# Patient Record
Sex: Male | Born: 1940 | Race: White | Hispanic: No | Marital: Married | Smoking: Former smoker
Health system: Southern US, Community
[De-identification: ages and names within clinical notes are randomized; demographics above are authoritative.]

## PROBLEM LIST (undated history)

## (undated) DIAGNOSIS — E785 Hyperlipidemia, unspecified: Secondary | ICD-10-CM

## (undated) DIAGNOSIS — I714 Abdominal aortic aneurysm, without rupture, unspecified: Secondary | ICD-10-CM

## (undated) DIAGNOSIS — K7460 Unspecified cirrhosis of liver: Secondary | ICD-10-CM

## (undated) DIAGNOSIS — N189 Chronic kidney disease, unspecified: Secondary | ICD-10-CM

## (undated) DIAGNOSIS — H6691 Otitis media, unspecified, right ear: Secondary | ICD-10-CM

## (undated) DIAGNOSIS — K760 Fatty (change of) liver, not elsewhere classified: Secondary | ICD-10-CM

## (undated) DIAGNOSIS — D61818 Other pancytopenia: Secondary | ICD-10-CM

## (undated) DIAGNOSIS — N529 Male erectile dysfunction, unspecified: Secondary | ICD-10-CM

## (undated) DIAGNOSIS — I6529 Occlusion and stenosis of unspecified carotid artery: Secondary | ICD-10-CM

## (undated) DIAGNOSIS — K7030 Alcoholic cirrhosis of liver without ascites: Secondary | ICD-10-CM

## (undated) DIAGNOSIS — I10 Essential (primary) hypertension: Secondary | ICD-10-CM

## (undated) DIAGNOSIS — G459 Transient cerebral ischemic attack, unspecified: Secondary | ICD-10-CM

## (undated) HISTORY — DX: Occlusion and stenosis of unspecified carotid artery: I65.29

## (undated) HISTORY — PX: CATARACT EXTRACTION, BILATERAL: SHX1313

## (undated) HISTORY — DX: Alcoholic cirrhosis of liver without ascites: K70.30

## (undated) HISTORY — PX: KIDNEY DONATION: SHX685

## (undated) HISTORY — PX: ESOPHAGOGASTRODUODENOSCOPY: SHX1529

## (undated) HISTORY — DX: Chronic kidney disease, unspecified: N18.9

## (undated) HISTORY — DX: Male erectile dysfunction, unspecified: N52.9

## (undated) HISTORY — DX: Transient cerebral ischemic attack, unspecified: G45.9

## (undated) HISTORY — DX: Unspecified cirrhosis of liver: K74.60

## (undated) HISTORY — DX: Abdominal aortic aneurysm, without rupture: I71.4

## (undated) HISTORY — DX: Fatty (change of) liver, not elsewhere classified: K76.0

## (undated) HISTORY — DX: Otitis media, unspecified, right ear: H66.91

## (undated) HISTORY — DX: Essential (primary) hypertension: I10

## (undated) HISTORY — DX: Abdominal aortic aneurysm, without rupture, unspecified: I71.40

## (undated) HISTORY — DX: Hyperlipidemia, unspecified: E78.5

---

## 2008-04-19 HISTORY — PX: CHOLECYSTECTOMY: SHX55

## 2010-01-21 ENCOUNTER — Ambulatory Visit (HOSPITAL_COMMUNITY): Admission: RE | Admit: 2010-01-21 | Discharge: 2010-01-21 | Payer: Self-pay | Admitting: Family Medicine

## 2010-06-07 ENCOUNTER — Emergency Department (HOSPITAL_COMMUNITY)
Admission: EM | Admit: 2010-06-07 | Discharge: 2010-06-07 | Payer: Self-pay | Source: Home / Self Care | Admitting: Emergency Medicine

## 2010-10-17 ENCOUNTER — Ambulatory Visit: Payer: Self-pay | Admitting: Internal Medicine

## 2010-11-05 ENCOUNTER — Ambulatory Visit (HOSPITAL_COMMUNITY)
Admission: RE | Admit: 2010-11-05 | Discharge: 2010-11-05 | Payer: Self-pay | Source: Home / Self Care | Attending: Ophthalmology | Admitting: Ophthalmology

## 2010-11-14 ENCOUNTER — Ambulatory Visit: Admit: 2010-11-14 | Payer: Self-pay | Admitting: Internal Medicine

## 2010-11-14 ENCOUNTER — Ambulatory Visit (HOSPITAL_COMMUNITY)
Admission: RE | Admit: 2010-11-14 | Discharge: 2010-11-14 | Payer: Self-pay | Source: Home / Self Care | Attending: Internal Medicine | Admitting: Internal Medicine

## 2010-11-14 HISTORY — PX: UPPER GASTROINTESTINAL ENDOSCOPY: SHX188

## 2010-11-15 LAB — H. PYLORI ANTIBODY, IGG: H Pylori IgG: 0.4 {ISR}

## 2010-12-01 ENCOUNTER — Encounter: Payer: Self-pay | Admitting: Family Medicine

## 2011-01-13 ENCOUNTER — Ambulatory Visit (INDEPENDENT_AMBULATORY_CARE_PROVIDER_SITE_OTHER): Payer: Self-pay | Admitting: Internal Medicine

## 2011-01-20 ENCOUNTER — Ambulatory Visit (INDEPENDENT_AMBULATORY_CARE_PROVIDER_SITE_OTHER): Payer: Self-pay | Admitting: Internal Medicine

## 2011-01-20 LAB — BASIC METABOLIC PANEL
BUN: 15 mg/dL (ref 6–23)
CO2: 25 mEq/L (ref 19–32)
Calcium: 9.5 mg/dL (ref 8.4–10.5)
Chloride: 107 mEq/L (ref 96–112)
Creatinine, Ser: 1.34 mg/dL (ref 0.4–1.5)
GFR calc Af Amer: >60 mL/min (ref >60)
GFR calc non Af Amer: 53 mL/min — ABNORMAL LOW (ref >60)
Glucose, Bld: 164 mg/dL — ABNORMAL HIGH (ref 70–99)
Potassium: 3.5 mEq/L (ref 3.5–5.1)
Sodium: 140 mEq/L (ref 135–145)

## 2011-01-20 LAB — HEMOGLOBIN AND HEMATOCRIT, BLOOD
HCT: 35.9 % — ABNORMAL LOW (ref 39.0–52.0)
Hemoglobin: 13 g/dL (ref 13.0–17.0)

## 2011-01-25 LAB — CBC
HCT: 35.3 % — ABNORMAL LOW (ref 39.0–52.0)
Hemoglobin: 12.4 g/dL — ABNORMAL LOW (ref 13.0–17.0)
MCH: 34.7 pg — ABNORMAL HIGH (ref 26.0–34.0)
MCHC: 35.1 g/dL (ref 30.0–36.0)
MCV: 98.8 fL (ref 78.0–100.0)
Platelets: 54 10*3/uL — ABNORMAL LOW (ref 150–400)
RBC: 3.57 MIL/uL — ABNORMAL LOW (ref 4.22–5.81)
RDW: 14.8 % (ref 11.5–15.5)
WBC: 3.5 10*3/uL — ABNORMAL LOW (ref 4.0–10.5)

## 2011-01-25 LAB — POCT I-STAT, CHEM 8
BUN: 12 mg/dL (ref 6–23)
Calcium, Ion: 1.12 mmol/L (ref 1.12–1.32)
Chloride: 111 mEq/L (ref 96–112)
Creatinine, Ser: 1.5 mg/dL (ref 0.4–1.5)
Glucose, Bld: 118 mg/dL — ABNORMAL HIGH (ref 70–99)
HCT: 36 % — ABNORMAL LOW (ref 39.0–52.0)
Hemoglobin: 12.2 g/dL — ABNORMAL LOW (ref 13.0–17.0)
Potassium: 3.3 mEq/L — ABNORMAL LOW (ref 3.5–5.1)
Sodium: 144 mEq/L (ref 135–145)
TCO2: 23 mmol/L (ref 0–100)

## 2011-01-25 LAB — DIFFERENTIAL
Basophils Absolute: 0 10*3/uL (ref 0.0–0.1)
Basophils Relative: 1 % (ref 0–1)
Eosinophils Absolute: 0.1 10*3/uL (ref 0.0–0.7)
Eosinophils Relative: 4 % (ref 0–5)
Lymphocytes Relative: 23 % (ref 12–46)
Lymphs Abs: 0.8 10*3/uL (ref 0.7–4.0)
Monocytes Absolute: 0.2 10*3/uL (ref 0.1–1.0)
Monocytes Relative: 6 % (ref 3–12)
Neutro Abs: 2.3 10*3/uL (ref 1.7–7.7)
Neutrophils Relative %: 66 % (ref 43–77)

## 2011-01-25 LAB — BASIC METABOLIC PANEL
BUN: 13 mg/dL (ref 6–23)
CO2: 24 mEq/L (ref 19–32)
Calcium: 8.5 mg/dL (ref 8.4–10.5)
Chloride: 111 mEq/L (ref 96–112)
Creatinine, Ser: 1.25 mg/dL (ref 0.4–1.5)
GFR calc Af Amer: >60 mL/min (ref >60)
GFR calc non Af Amer: 57 mL/min — ABNORMAL LOW (ref >60)
Glucose, Bld: 125 mg/dL — ABNORMAL HIGH (ref 70–99)
Potassium: 3.3 mEq/L — ABNORMAL LOW (ref 3.5–5.1)
Sodium: 142 mEq/L (ref 135–145)

## 2011-02-13 LAB — BASIC METABOLIC PANEL
Glucose: 131 mg/dL
Potassium: 3.6 mmol/L (ref 3.4–5.3)
Sodium: 141 mmol/L (ref 137–147)

## 2011-02-13 LAB — HEPATIC FUNCTION PANEL
ALT: 40 U/L (ref 10–40)
AST: 48 U/L — AB (ref 14–40)

## 2011-02-13 LAB — CBC AND DIFFERENTIAL
HCT: 36 % — AB (ref 41–53)
Hemoglobin: 12.4 g/dL — AB (ref 13.5–17.5)
Platelets: 54 10*3/uL — AB (ref 150–399)

## 2011-02-17 ENCOUNTER — Ambulatory Visit (INDEPENDENT_AMBULATORY_CARE_PROVIDER_SITE_OTHER): Payer: Medicare Other | Admitting: Internal Medicine

## 2011-02-17 ENCOUNTER — Ambulatory Visit (INDEPENDENT_AMBULATORY_CARE_PROVIDER_SITE_OTHER): Payer: Self-pay | Admitting: Internal Medicine

## 2011-02-17 DIAGNOSIS — K7689 Other specified diseases of liver: Secondary | ICD-10-CM

## 2011-03-21 NOTE — Consult Note (Signed)
NAME:  Charles Hull, ALDACO NO.:  [id number]  MEDICAL RECORD NO.:  [id number]           PATIENT TYPE:  LOCATION:                                 FACILITY:  PHYSICIAN:  Lionel December, M.D.    DATE OF BIRTH:  [birth year]  DATE OF CONSULTATION: 02/17/2011 DATE OF DISCHARGE:                                CONSULTATION   REASON FOR CONSULTATION:  Followup for nonalcoholic cirrhosis.  LOCATION:  [facility].  HISTORY OF PRESENT ILLNESS:  Charles Hull is a 70 year old male presenting today for follow up of his nonalcoholic liver disease.  He was seen initially by Dr. Karilyn Cota in September 2009 for newly diagnosed cirrhosis that was found during cholecystectomy.  He did have a negative workup and Dr. 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  There is a family history of NAFLD.  He apparently did not follow up.  His hepatitis B and C markers were negative.  His AST was normal in August 2011.  He was admitted to [facility] with a febrile illness.  He did undergo an abdominal tap.  The tap was performed after he had been on antibiotics for few days.  The fluid cultures were negative.  Today, Charles Hull states he feels okay.  He says his abdomen feels more distended than usual.  There has been no weight gain.  His appetite is good.  He usually has a bowel movement 1 every 2 days.  He denies any melena or rectal bleeding.  Mr. Even did undergo an EGD in January of this year.  The EGD revealed grade 1-2 four columns of esophageal varices, portal gastropathy, 3-mm gastric ulcer at the antrum.  RECENT LABS:  On February 13, 2011, his ALP 90, AST 48, ALT 40, albumin 3.4. INR 1.2, PT is slightly elevated at 15.  Hemoglobin 12.4, hematocrit 35.8, platelets 54.  AFP 4.9, creatinine slightly elevated at 1.42, BUN 17.  HOME MEDICATIONS: 1. Flomax 0.4 mg daily. 2. Spirolactone 50 mg 1 b.i.d. 3. Lasix 40 mg Monday, Wednesday, and Fridays. 4. Cipro 750 mg  once a week. 5. B12 1000 mcg a day. 6. MVI once a day. 7. Nadolol 40 mg a day. 8. Protonix 40 mg a day. 9. Cipro 400 mg on Saturdays, 750-mg Cipro.  There are no known allergies.  OBJECTIVE:  VITAL SIGNS:  His weight is 192.6, his last weight in August was 193, his blood pressure is 112/60, his temperature is 97.4, his pulse is 80. HEENT:  His conjunctivae pink.  His sclerae anicteric.  Thyroid normal. There is no cervical lymphadenopathy. LUNGS:  Clear. HEART:  Regular rate and rhythm. ABDOMEN:  Appears distended but soft.  His bowel sounds are positive.  I am unable to palpate his liver.  Bowel sounds are positive.  He has 1+ edema to his lower extremities.  ASSESSMENT:  Charles Hull has cirrhosis, possibly secondary to nonalcoholic fatty liver disease which has been complicated by ascites. Today, he seems to be doing well.  His abdomen is distended but it is soft.  He does complain also of a  rash to his left flank.  This rash appears to come and go, says does not itch, it is not causing pain.  RECOMMENDATIONS:  He will follow up in 3 months with a CBC, PT/INR, and chem-20.  He will continue present medications.  He will increase his Lasix to 5 days a week.  His last potassium on February 13, 2011, was 3.6.    ______________________________ Dorene Ar, NP   ______________________________ Lionel December, M.D.    TS/MEDQ  D:  02/17/2011  T:  02/18/2011  Job:  [id number]  Electronically Signed by Dorene Ar PA on 03/20/2011 05:22:42 PM Electronically Signed by Lionel December M.D. on 03/21/2011 12:09:56 PM

## 2011-06-26 ENCOUNTER — Encounter (INDEPENDENT_AMBULATORY_CARE_PROVIDER_SITE_OTHER): Payer: Self-pay

## 2011-08-12 ENCOUNTER — Other Ambulatory Visit (INDEPENDENT_AMBULATORY_CARE_PROVIDER_SITE_OTHER): Payer: Self-pay | Admitting: Internal Medicine

## 2011-08-12 ENCOUNTER — Encounter (INDEPENDENT_AMBULATORY_CARE_PROVIDER_SITE_OTHER): Payer: Self-pay | Admitting: Internal Medicine

## 2011-08-12 ENCOUNTER — Ambulatory Visit (INDEPENDENT_AMBULATORY_CARE_PROVIDER_SITE_OTHER): Payer: Medicare Other | Admitting: Internal Medicine

## 2011-08-12 VITALS — BP 142/70 | HR 76 | Temp 98.1°F | Ht 68.0 in | Wt 201.2 lb

## 2011-08-12 DIAGNOSIS — R188 Other ascites: Secondary | ICD-10-CM

## 2011-08-12 DIAGNOSIS — K7460 Unspecified cirrhosis of liver: Secondary | ICD-10-CM

## 2011-08-12 NOTE — Progress Notes (Signed)
Subjective:     Patient ID: Charles Hull, male   DOB: 05/20/1941, 70 y.o.   MRN: [id number]  HPI  Presenting today with c/o that he has gained about 6-7 pounds over the last 6 days. He says his abdomen is some swollen.  He has a history of cirrhosis secondary to NASH. He had a SBP last year.  He was diagnosed in 2009 after undergoing a cholecystecomy.  EGD 11/15/2010  Grade 1-2 four columns of esophageal varices. Portal gastropathy. 3mm gastric ulcer at antrum.   Appetite is good.  Occasionally he has epigastric pain when he lies down.  He has a BM about every day.  Stools are dark brown.  His last US abdomen 03/08/2008: Contracted thick walled GB with stones. Suspicious for chronic cholecystitis. No biliary ductal dilatation. Status post left nephrectomy. RT renal cyst. Multifocal ectasia of the distal abdominal aorta.  08/01/2011 Glucose 18, BUN 17, Creatinine 1.33, Sodium 140, K 3.5, Chloride 107, Albumin 3.4, Total globulin 2.8, Total bili 1.1, ALP 92, AST 43, ALT 34, HA1C 6.2  02/13/2011 AFP 4.9, H and H 12.4 and 35.8, MCV 98.3, Platelet ct 54, PT/INR 15.0 and 1.2, ALP 90, AST 48, ALT 40,   Review of Systems  See hpi     Objective:   Physical Exam Alert and oriented. Skin warm and dry. Oral mucosa is moist.  . Sclera anicteric, conjunctivae is pink. Thyroid not enlarged. No cervical lymphadenopathy. Lungs clear. Heart regular rate and rhythm.  Abdomen is soft, distended. Abdomen is not tense.  Bowel sounds are positive. No hepatomegaly. No abdominal masses felt. No tenderness.  2-3+ edema to lower extremities. Patient is alert and oriented.     Assessment:  Cirrhosis secondary to NAFLD. Ascites., probable. Hx of SBP last year.  His cultures were negative. He is maintained on Cipro once a week.  Meld score 13.     Plan:     Will see back in 6 months with  Cmet,CBC, PT/INR.  continue Lasix 40mg  5 days a weeks.  Increase Spironolactone 50mg  to twice a day.

## 2011-08-13 ENCOUNTER — Telehealth (INDEPENDENT_AMBULATORY_CARE_PROVIDER_SITE_OTHER): Payer: Self-pay | Admitting: *Deleted

## 2011-08-13 DIAGNOSIS — K7460 Unspecified cirrhosis of liver: Secondary | ICD-10-CM

## 2011-08-13 LAB — PROTIME-INR
INR: 1.3 (ref <1.50)
Prothrombin Time: 16.7 seconds — ABNORMAL HIGH (ref 11.6–15.2)

## 2011-08-13 NOTE — Telephone Encounter (Signed)
Per Dorene Ar, NP on [phone] the patient will need a office visit in 6 months with the following labs: Comprehensive Panel and PT/INR. These test are noted for 02-11-11.

## 2011-08-14 LAB — AFP TUMOR MARKER: AFP-Tumor Marker: 5.9 ng/mL (ref 0.0–8.0)

## 2011-08-15 ENCOUNTER — Ambulatory Visit (HOSPITAL_COMMUNITY)
Admission: RE | Admit: 2011-08-15 | Discharge: 2011-08-15 | Disposition: A | Discharge status: Home / Self Care | Payer: Medicare Other | Source: Ambulatory Visit | Attending: Internal Medicine | Admitting: Internal Medicine

## 2011-08-15 DIAGNOSIS — K7460 Unspecified cirrhosis of liver: Secondary | ICD-10-CM | POA: Insufficient documentation

## 2011-08-15 DIAGNOSIS — I714 Abdominal aortic aneurysm, without rupture, unspecified: Secondary | ICD-10-CM | POA: Insufficient documentation

## 2011-08-15 DIAGNOSIS — R188 Other ascites: Secondary | ICD-10-CM | POA: Insufficient documentation

## 2011-08-15 DIAGNOSIS — Z940 Kidney transplant status: Secondary | ICD-10-CM | POA: Insufficient documentation

## 2011-08-19 ENCOUNTER — Ambulatory Visit (INDEPENDENT_AMBULATORY_CARE_PROVIDER_SITE_OTHER): Payer: Medicare Other | Admitting: Internal Medicine

## 2011-09-12 ENCOUNTER — Encounter: Payer: Self-pay | Admitting: Vascular Surgery

## 2011-09-17 ENCOUNTER — Encounter: Payer: Self-pay | Admitting: Vascular Surgery

## 2011-09-18 ENCOUNTER — Ambulatory Visit (INDEPENDENT_AMBULATORY_CARE_PROVIDER_SITE_OTHER): Payer: Medicare Other | Admitting: Vascular Surgery

## 2011-09-18 ENCOUNTER — Encounter: Payer: Self-pay | Admitting: Vascular Surgery

## 2011-09-18 VITALS — BP 148/75 | HR 70 | Resp 18 | Ht 68.0 in | Wt 196.0 lb

## 2011-09-18 DIAGNOSIS — I714 Abdominal aortic aneurysm, without rupture: Secondary | ICD-10-CM

## 2011-09-18 DIAGNOSIS — R0989 Other specified symptoms and signs involving the circulatory and respiratory systems: Secondary | ICD-10-CM

## 2011-09-18 NOTE — Progress Notes (Signed)
Addended by: Sharee Pimple on: 09/18/2011 11:27 AM   Modules accepted: Orders
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
## 2011-09-18 NOTE — Progress Notes (Signed)
Addended by: Sharee Pimple on: 09/18/2011 03:50 PM   Modules accepted: Orders

## 2011-09-19 ENCOUNTER — Other Ambulatory Visit: Payer: Self-pay | Admitting: *Deleted

## 2011-09-19 DIAGNOSIS — I714 Abdominal aortic aneurysm, without rupture: Secondary | ICD-10-CM

## 2011-10-06 ENCOUNTER — Telehealth (INDEPENDENT_AMBULATORY_CARE_PROVIDER_SITE_OTHER): Payer: Self-pay | Admitting: *Deleted

## 2011-10-06 NOTE — Telephone Encounter (Signed)
This is for Dr. Rehman 

## 2011-10-06 NOTE — Telephone Encounter (Signed)
His Nadolol is going up from $6 to $45 the first of the year. Would like to know if there is something cheaper he can replace this with? Please return the call to [phone].

## 2011-10-07 ENCOUNTER — Other Ambulatory Visit (INDEPENDENT_AMBULATORY_CARE_PROVIDER_SITE_OTHER): Payer: Self-pay | Admitting: Internal Medicine

## 2011-10-07 DIAGNOSIS — K766 Portal hypertension: Secondary | ICD-10-CM

## 2011-10-07 DIAGNOSIS — I8500 Esophageal varices without bleeding: Secondary | ICD-10-CM

## 2011-10-07 MED ORDER — PROPRANOLOL HCL 20 MG PO TABS: 20.0000 mg | ORAL_TABLET | Freq: Three times a day (TID) | ORAL | Status: DC

## 2011-10-07 NOTE — Telephone Encounter (Signed)
Forwarded to Dr.Rehman to address. 

## 2011-10-07 NOTE — Telephone Encounter (Signed)
Will switch him to propranolol 20 mg twice daily.

## 2011-10-07 NOTE — Telephone Encounter (Signed)
This has been addressed, refer to other telephone encounter

## 2011-10-08 NOTE — Telephone Encounter (Signed)
Dr. Karilyn Cota wrote a prescription for Propranolol (Inderal) 20 mg tablet . Take 1 tablet (20 mg total) by mouth 3 (three) times daily. # 60 with 5 refills. I spoke with the patient's wife this morning and she states they use [facility] in [place] as their Pharmacy, and that they would need this the first of the year. I told her that I was going to fax it to [facility] and she may want to call them and let them know to put this RX on hold until that time. She is going to do that.

## 2011-10-15 ENCOUNTER — Encounter: Payer: Self-pay | Admitting: Vascular Surgery

## 2011-10-16 ENCOUNTER — Other Ambulatory Visit: Payer: Medicare Other

## 2011-10-16 ENCOUNTER — Other Ambulatory Visit (INDEPENDENT_AMBULATORY_CARE_PROVIDER_SITE_OTHER): Payer: Medicare Other | Admitting: *Deleted

## 2011-10-16 ENCOUNTER — Ambulatory Visit (INDEPENDENT_AMBULATORY_CARE_PROVIDER_SITE_OTHER): Payer: Medicare Other | Admitting: Vascular Surgery

## 2011-10-16 ENCOUNTER — Encounter: Payer: Self-pay | Admitting: Vascular Surgery

## 2011-10-16 ENCOUNTER — Ambulatory Visit
Admission: RE | Admit: 2011-10-16 | Discharge: 2011-10-16 | Disposition: A | Discharge status: Home / Self Care | Payer: Medicare Other | Source: Ambulatory Visit | Attending: Vascular Surgery | Admitting: Vascular Surgery

## 2011-10-16 VITALS — BP 135/77 | HR 66 | Resp 16 | Ht 68.0 in | Wt 198.0 lb

## 2011-10-16 DIAGNOSIS — I714 Abdominal aortic aneurysm, without rupture: Secondary | ICD-10-CM

## 2011-10-16 DIAGNOSIS — R0989 Other specified symptoms and signs involving the circulatory and respiratory systems: Secondary | ICD-10-CM
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
## 2011-11-07 ENCOUNTER — Other Ambulatory Visit (INDEPENDENT_AMBULATORY_CARE_PROVIDER_SITE_OTHER): Payer: Self-pay | Admitting: Internal Medicine

## 2011-11-17 ENCOUNTER — Other Ambulatory Visit: Payer: Self-pay

## 2011-11-17 NOTE — Telephone Encounter (Signed)
Charles Hull pt.

## 2011-11-17 NOTE — Telephone Encounter (Signed)
I have called [facility] to let them know

## 2011-11-17 NOTE — Telephone Encounter (Signed)
Please inform pharmacy that this is a Rehman patient.

## 2011-12-03 ENCOUNTER — Other Ambulatory Visit (INDEPENDENT_AMBULATORY_CARE_PROVIDER_SITE_OTHER): Payer: Self-pay | Admitting: Internal Medicine

## 2011-12-03 NOTE — Procedures (Unsigned)
CAROTID DUPLEX EXAM  INDICATION:  Bilateral carotid bruit.  HISTORY: AAA Diabetes:  Yes. Cardiac:  No. Hypertension:  Yes. Smoking:  Previous. Previous Surgery:  No. CV History:  TIA. Amaurosis Fugax No, Paresthesias No, Hemiparesis No.                                      RIGHT             LEFT Brachial systolic pressure:         120               120 Brachial Doppler waveforms:         WNL               WNL Vertebral direction of flow:        Antegrade         Antegrade DUPLEX VELOCITIES (cm/sec) CCA peak systolic                   113               296 ECA peak systolic                   257               373 ICA peak systolic                   101               313 ICA end diastolic                   19                67 PLAQUE MORPHOLOGY:                  Heterogenous      Heterogenous PLAQUE AMOUNT:                      Moderate          Moderate-to-severe PLAQUE LOCATION:                    ECA               Bifurcation  IMPRESSION: 1. 1% to 39% right internal carotid artery stenosis. 2. 60% to 79% left carotid stenosis at the bifurcation with extension     into the internal carotid artery and external carotid artery. 3. Right external carotid artery stenosis is observed. 4. Bilateral vertebral arteries are within normal limits.  ___________________________________________ Janetta Hora Fields, MD  LT/MEDQ  D:  10/16/2011  T:  10/16/2011  Job:  [id number]

## 2012-01-08 ENCOUNTER — Encounter (INDEPENDENT_AMBULATORY_CARE_PROVIDER_SITE_OTHER): Payer: Self-pay | Admitting: *Deleted

## 2012-01-15 ENCOUNTER — Ambulatory Visit (INDEPENDENT_AMBULATORY_CARE_PROVIDER_SITE_OTHER): Payer: Medicare Other | Admitting: Internal Medicine

## 2012-01-22 ENCOUNTER — Encounter (INDEPENDENT_AMBULATORY_CARE_PROVIDER_SITE_OTHER): Payer: Self-pay | Admitting: Internal Medicine

## 2012-01-22 ENCOUNTER — Ambulatory Visit (INDEPENDENT_AMBULATORY_CARE_PROVIDER_SITE_OTHER): Payer: Medicare Other | Admitting: Internal Medicine

## 2012-01-22 VITALS — BP 128/58 | HR 60 | Temp 97.4°F | Ht 68.0 in | Wt 194.4 lb

## 2012-01-22 DIAGNOSIS — I10 Essential (primary) hypertension: Secondary | ICD-10-CM

## 2012-01-22 DIAGNOSIS — K7460 Unspecified cirrhosis of liver: Secondary | ICD-10-CM | POA: Insufficient documentation

## 2012-01-22 DIAGNOSIS — E785 Hyperlipidemia, unspecified: Secondary | ICD-10-CM

## 2012-01-22 NOTE — Patient Instructions (Addendum)
Continue present medications.  OV 3 months.
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
## 2012-01-30 ENCOUNTER — Encounter (INDEPENDENT_AMBULATORY_CARE_PROVIDER_SITE_OTHER): Payer: Self-pay | Admitting: *Deleted

## 2012-02-08 ENCOUNTER — Other Ambulatory Visit (INDEPENDENT_AMBULATORY_CARE_PROVIDER_SITE_OTHER): Payer: Self-pay | Admitting: Internal Medicine

## 2012-02-09 ENCOUNTER — Other Ambulatory Visit (INDEPENDENT_AMBULATORY_CARE_PROVIDER_SITE_OTHER): Payer: Self-pay | Admitting: Internal Medicine

## 2012-02-10 LAB — COMPREHENSIVE METABOLIC PANEL
ALT: 35 U/L (ref 0–53)
AST: 49 U/L — ABNORMAL HIGH (ref 0–37)
Albumin: 3.5 g/dL (ref 3.5–5.2)
Alkaline Phosphatase: 90 U/L (ref 39–117)
BUN: 17 mg/dL (ref 6–23)
CO2: 22 mEq/L (ref 19–32)
Calcium: 9.2 mg/dL (ref 8.4–10.5)
Chloride: 106 mEq/L (ref 96–112)
Creat: 1.61 mg/dL — ABNORMAL HIGH (ref 0.50–1.35)
Glucose, Bld: 216 mg/dL — ABNORMAL HIGH (ref 70–99)
Potassium: 4.1 mEq/L (ref 3.5–5.3)
Sodium: 138 mEq/L (ref 135–145)
Total Bilirubin: 1.5 mg/dL — ABNORMAL HIGH (ref 0.3–1.2)
Total Protein: 6.5 g/dL (ref 6.0–8.3)

## 2012-02-10 LAB — PROTIME-INR
INR: 1.27 (ref <1.50)
Prothrombin Time: 16.4 seconds — ABNORMAL HIGH (ref 11.6–15.2)

## 2012-02-10 NOTE — Progress Notes (Signed)
Meld score 15.

## 2012-04-12 ENCOUNTER — Other Ambulatory Visit: Payer: Self-pay | Admitting: *Deleted

## 2012-04-12 DIAGNOSIS — I714 Abdominal aortic aneurysm, without rupture: Secondary | ICD-10-CM

## 2012-04-15 ENCOUNTER — Ambulatory Visit (INDEPENDENT_AMBULATORY_CARE_PROVIDER_SITE_OTHER): Payer: Medicare Other | Admitting: *Deleted

## 2012-04-15 ENCOUNTER — Other Ambulatory Visit (INDEPENDENT_AMBULATORY_CARE_PROVIDER_SITE_OTHER): Payer: Medicare Other | Admitting: *Deleted

## 2012-04-15 DIAGNOSIS — I714 Abdominal aortic aneurysm, without rupture: Secondary | ICD-10-CM

## 2012-04-15 DIAGNOSIS — I6529 Occlusion and stenosis of unspecified carotid artery: Secondary | ICD-10-CM

## 2012-04-23 NOTE — Procedures (Unsigned)
DUPLEX ULTRASOUND OF ABDOMINAL AORTA  INDICATION:  Abdominal aortic aneurysm.  HISTORY: Diabetes:  Yes Cardiac:  No Hypertension:  Yes Smoking:  Previous. Connective Tissue Disorder: Family History:  No Previous Surgery:  Kidney donation.  DUPLEX EXAM:         AP (cm)                   TRANSVERSE (cm) Proximal             2.6 cm                    2.5 cm Mid                  4.5 cm                    4.3 cm Distal               3.8 cm                    4.0 cm Right Iliac          Not visualized.           Not visualized. Left Iliac           Not visualized.           Not visualized.  PREVIOUS:  Date: 09/19/2011 (CT)  AP:  4.3  TRANSVERSE:  IMPRESSION:  Bilobar, aneurysmal dilatation of the mid and distal abdominal aorta based on limited visualization with no significant change in maximum diameter when compared to the previous CT. Unable to adequately visualize the bilateral common iliac arteries due to overlying bowel gas patterns.  ___________________________________________ Janetta Hora Fields, MD  CH/MEDQ  D:  04/19/2012  T:  04/19/2012  Job:  [id number]

## 2012-04-23 NOTE — Procedures (Unsigned)
CAROTID DUPLEX EXAM  INDICATION:  Carotid disease.  HISTORY: Diabetes:  Yes. Cardiac:  No . Hypertension:  Yes. Smoking:  Previous. Previous Surgery:  No. CV History:  Asymptomatic. Amaurosis Fugax No, Paresthesias No, Hemiparesis No                                      RIGHT             LEFT Brachial systolic pressure:         140               146 Brachial Doppler waveforms:         Normal.           Normal. Vertebral direction of flow:        Antegrade.        Antegrade. DUPLEX VELOCITIES (cm/sec) CCA peak systolic                   85                96 ECA peak systolic                   162               298 ICA peak systolic                   89                297 ICA end diastolic                   19                67 PLAQUE MORPHOLOGY:                  Mixed.            Mixed. PLAQUE AMOUNT:                      Mild.             Moderate. PLAQUE LOCATION:                    ECA/distal CCA/bifurcation.         ICA/ECA/CCA.  IMPRESSION:  No evidence of right internal carotid artery stenosis. Doppler velocities suggest 60%-79% stenosis of the left proximal internal carotid artery. Bilateral external carotid artery stenosis noted. No significant change in the bilateral carotid arteries noted when compared to the previous exam on 10/16/2011.  ___________________________________________ Janetta Hora. Darrick Penna, M.D.  CH/MEDQ  D:  04/19/2012  T:  04/19/2012  Job:  [id number]

## 2012-07-22 ENCOUNTER — Ambulatory Visit (INDEPENDENT_AMBULATORY_CARE_PROVIDER_SITE_OTHER): Payer: Medicare Other | Admitting: Internal Medicine

## 2012-07-22 ENCOUNTER — Encounter (INDEPENDENT_AMBULATORY_CARE_PROVIDER_SITE_OTHER): Payer: Self-pay | Admitting: Internal Medicine

## 2012-07-22 VITALS — BP 112/50 | HR 80 | Temp 98.1°F | Ht 68.0 in | Wt 188.2 lb

## 2012-07-22 DIAGNOSIS — K7460 Unspecified cirrhosis of liver: Secondary | ICD-10-CM

## 2012-07-22 LAB — COMPREHENSIVE METABOLIC PANEL
AST: 42 U/L — ABNORMAL HIGH (ref 0–37)
Albumin: 3.4 g/dL — ABNORMAL LOW (ref 3.5–5.2)
BUN: 16 mg/dL (ref 6–23)
CO2: 24 mEq/L (ref 19–32)
Calcium: 9.3 mg/dL (ref 8.4–10.5)
Chloride: 105 mEq/L (ref 96–112)
Glucose, Bld: 110 mg/dL — ABNORMAL HIGH (ref 70–99)
Potassium: 3.9 mEq/L (ref 3.5–5.3)

## 2012-07-22 LAB — CBC WITH DIFFERENTIAL/PLATELET
Basophils Relative: 2 % — ABNORMAL HIGH (ref 0–1)
Hemoglobin: 12 g/dL — ABNORMAL LOW (ref 13.0–17.0)
MCHC: 35.2 g/dL (ref 30.0–36.0)
Monocytes Relative: 12 % (ref 3–12)
Neutro Abs: 1.5 10*3/uL — ABNORMAL LOW (ref 1.7–7.7)
Neutrophils Relative %: 55 % (ref 43–77)
RBC: 3.65 MIL/uL — ABNORMAL LOW (ref 4.22–5.81)

## 2012-07-22 NOTE — Patient Instructions (Addendum)
OV in 6 months.  Labs today.  
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
## 2012-07-23 ENCOUNTER — Telehealth (INDEPENDENT_AMBULATORY_CARE_PROVIDER_SITE_OTHER): Payer: Self-pay | Admitting: *Deleted

## 2012-07-23 DIAGNOSIS — K7460 Unspecified cirrhosis of liver: Secondary | ICD-10-CM

## 2012-07-23 LAB — AFP TUMOR MARKER: AFP-Tumor Marker: 7 ng/mL (ref 0.0–8.0)

## 2012-07-23 LAB — PROTIME-INR: Prothrombin Time: 17.3 seconds — ABNORMAL HIGH (ref 11.6–15.2)

## 2012-07-23 NOTE — Telephone Encounter (Signed)
Per Charles Hull the patient will need to have the noted labs done before his next office visit 01/18/13. Patent will be sent a letter as a reminder.

## 2012-08-09 ENCOUNTER — Other Ambulatory Visit (INDEPENDENT_AMBULATORY_CARE_PROVIDER_SITE_OTHER): Payer: Self-pay | Admitting: Internal Medicine

## 2012-10-06 ENCOUNTER — Other Ambulatory Visit: Payer: Self-pay | Admitting: *Deleted

## 2012-10-06 DIAGNOSIS — I6529 Occlusion and stenosis of unspecified carotid artery: Secondary | ICD-10-CM

## 2012-10-13 ENCOUNTER — Encounter: Payer: Self-pay | Admitting: Neurosurgery

## 2012-10-14 ENCOUNTER — Other Ambulatory Visit (INDEPENDENT_AMBULATORY_CARE_PROVIDER_SITE_OTHER): Payer: Medicare Other | Admitting: *Deleted

## 2012-10-14 ENCOUNTER — Encounter (INDEPENDENT_AMBULATORY_CARE_PROVIDER_SITE_OTHER): Payer: Medicare Other | Admitting: *Deleted

## 2012-10-14 ENCOUNTER — Encounter: Payer: Self-pay | Admitting: Neurosurgery

## 2012-10-14 ENCOUNTER — Ambulatory Visit (INDEPENDENT_AMBULATORY_CARE_PROVIDER_SITE_OTHER): Payer: Medicare Other | Admitting: Neurosurgery

## 2012-10-14 VITALS — BP 134/68 | HR 75 | Resp 18 | Ht 68.0 in | Wt 185.0 lb

## 2012-10-14 DIAGNOSIS — I714 Abdominal aortic aneurysm, without rupture, unspecified: Secondary | ICD-10-CM | POA: Insufficient documentation

## 2012-10-14 DIAGNOSIS — I6529 Occlusion and stenosis of unspecified carotid artery: Secondary | ICD-10-CM | POA: Insufficient documentation
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
## 2012-10-15 NOTE — Addendum Note (Signed)
Addended by: Sharee Pimple on: 10/15/2012 08:13 AM   Modules accepted: Orders

## 2012-10-28 ENCOUNTER — Other Ambulatory Visit (INDEPENDENT_AMBULATORY_CARE_PROVIDER_SITE_OTHER): Payer: Self-pay | Admitting: Internal Medicine

## 2012-12-03 ENCOUNTER — Other Ambulatory Visit (INDEPENDENT_AMBULATORY_CARE_PROVIDER_SITE_OTHER): Payer: Self-pay | Admitting: Internal Medicine

## 2013-01-06 ENCOUNTER — Encounter (INDEPENDENT_AMBULATORY_CARE_PROVIDER_SITE_OTHER): Payer: Self-pay | Admitting: *Deleted

## 2013-01-06 ENCOUNTER — Other Ambulatory Visit (INDEPENDENT_AMBULATORY_CARE_PROVIDER_SITE_OTHER): Payer: Self-pay | Admitting: *Deleted

## 2013-01-06 DIAGNOSIS — K7460 Unspecified cirrhosis of liver: Secondary | ICD-10-CM

## 2013-01-07 ENCOUNTER — Other Ambulatory Visit (INDEPENDENT_AMBULATORY_CARE_PROVIDER_SITE_OTHER): Payer: Self-pay | Admitting: Internal Medicine

## 2013-01-12 LAB — CBC WITH DIFFERENTIAL/PLATELET
Eosinophils Absolute: 0.1 10*3/uL (ref 0.0–0.7)
Eosinophils Relative: 4 % (ref 0–5)
Hemoglobin: 11.9 g/dL — ABNORMAL LOW (ref 13.0–17.0)
Lymphs Abs: 0.6 10*3/uL — ABNORMAL LOW (ref 0.7–4.0)
MCH: 32.3 pg (ref 26.0–34.0)
MCHC: 35.1 g/dL (ref 30.0–36.0)
MCV: 92.1 fL (ref 78.0–100.0)
Monocytes Relative: 11 % (ref 3–12)
RBC: 3.68 MIL/uL — ABNORMAL LOW (ref 4.22–5.81)

## 2013-01-12 LAB — COMPREHENSIVE METABOLIC PANEL
BUN: 14 mg/dL (ref 6–23)
CO2: 22 mEq/L (ref 19–32)
Calcium: 8.7 mg/dL (ref 8.4–10.5)
Chloride: 110 mEq/L (ref 96–112)
Creat: 1.53 mg/dL — ABNORMAL HIGH (ref 0.50–1.35)

## 2013-01-13 ENCOUNTER — Telehealth (INDEPENDENT_AMBULATORY_CARE_PROVIDER_SITE_OTHER): Payer: Self-pay | Admitting: Internal Medicine

## 2013-01-13 NOTE — Telephone Encounter (Signed)
MELD SCORE 15

## 2013-01-18 ENCOUNTER — Ambulatory Visit (INDEPENDENT_AMBULATORY_CARE_PROVIDER_SITE_OTHER): Payer: Medicare Other | Admitting: Internal Medicine

## 2013-01-18 ENCOUNTER — Encounter (INDEPENDENT_AMBULATORY_CARE_PROVIDER_SITE_OTHER): Payer: Self-pay | Admitting: Internal Medicine

## 2013-01-18 VITALS — BP 108/68 | HR 80 | Temp 97.3°F | Resp 18 | Ht 68.0 in | Wt 187.7 lb

## 2013-01-18 DIAGNOSIS — R188 Other ascites: Secondary | ICD-10-CM

## 2013-01-18 DIAGNOSIS — K7460 Unspecified cirrhosis of liver: Secondary | ICD-10-CM

## 2013-01-18 DIAGNOSIS — K766 Portal hypertension: Secondary | ICD-10-CM

## 2013-01-18 MED ORDER — FUROSEMIDE 40 MG PO TABS: 40.0000 mg | ORAL_TABLET | Freq: Every day | ORAL | Status: DC

## 2013-01-18 MED ORDER — PROPRANOLOL HCL 20 MG PO TABS: 20.0000 mg | ORAL_TABLET | Freq: Two times a day (BID) | ORAL | Status: DC

## 2013-01-18 MED ORDER — CIPROFLOXACIN HCL 500 MG PO TABS: 500.0000 mg | ORAL_TABLET | Freq: Every day | ORAL | Status: DC

## 2013-01-18 NOTE — Progress Notes (Signed)
Presenting complaint;  Follow for cirrhosis and ascites.  Subjective:  Patient is 72 year old Caucasian male who was cirrhosis secondary to NAFLD complicated by ascites who is here for scheduled visit. He was last seen 6 months ago. He feels fine. He noted abdominal swelling few days ago. He did not experience pain or weight gain. He weighs the same as he did 6 months ago. He has good appetite. His bowels move daily or every other day. He has changes eating habits since his wife was diagnosed with diabetes. He denies melena rectal bleeding or confusion. He does complain of postal symptoms often.   Current Medications: Current Outpatient Prescriptions  Medication Sig Dispense Refill  . aspirin 81 MG tablet Take 81 mg by mouth daily.      . ciprofloxacin (CIPRO) 750 MG tablet TAKE ONE TABLET BY MOUTH ONCE A WEEK  12 tablet  3  . doxazosin (CARDURA) 4 MG tablet Take 4 mg by mouth at bedtime.        . furosemide (LASIX) 40 MG tablet Take 40 mg by mouth 2 (two) times daily. 5 days a week       . multivitamin (THERAGRAN) per tablet Take 1 tablet by mouth daily.        . pantoprazole (PROTONIX) 40 MG tablet TAKE ONE TABLET BY MOUTH IN THE MORNING  30 tablet  11  . propranolol (INDERAL) 20 MG tablet TAKE ONE TABLET BY MOUTH THREE TIMES DAILY  60 tablet  5  . spironolactone (ALDACTONE) 50 MG tablet Take 50 mg by mouth daily.        . vitamin B-12 (CYANOCOBALAMIN) 1000 MCG tablet Take 1,000 mcg by mouth daily.        . [DISCONTINUED] nadolol (CORGARD) 40 MG tablet Take 40 mg by mouth daily.         No current facility-administered medications for this visit.     Objective: Blood pressure 108/68, pulse 80, temperature 97.3 F (36.3 C), temperature source Oral, resp. rate 18, height 5\' 8"  (1.727 m), weight 187 lb 11.2 oz (85.14 kg). Patient is alert and does not have asterixis. Conjunctiva is pink. Sclera is nonicteric Oropharyngeal mucosa is normal. No neck masses or thyromegaly  noted. Cardiac exam with regular rhythm normal S1 and S2. No murmur or gallop noted. Lungs are clear to auscultation. Abdomen is protuberant but not tense. Shifting dullness is present. No hepatosplenomegaly or masses noted. No LE edema or clubbing noted.  Labs/studies Results: Labs from 01/12/2013 as follows. Alpha-fetoprotein was 5.8. WBC was 3.0, H&H 11.9 and 33.9 and platelet count 61K. Electrolytes within normal limits. Glucose 109. BUN 14, creatinine 1.53 Bilirubin 1.3, AP 92, AST 36, ALT 21 total protein 6.6 and albumin 3.4. INR was 1.38.    Assessment:  #1. Cirrhotic ascites. He has noted abdominal distention. His weight has not changed in last 6 months. He does not have tense ascites. He is on Cipro once a week for SP prophylaxis. Burning recommendation it seems to be every day. His MELD score is 15. He has history of esophageal varices. Last EGD was in January 2012 revealing 4 columns of grade 1-2 bases. #2. Postural symptoms symptoms secondary propranolol. #3. Elevated serum creatinine. Need to watch renal function closely while he is on furosemide and spironolactone.    Plan:  Change Cipro to 500 mg by mouth daily. Decrease propranolol to 20 mg by mouth twice a day. Hepatobiliary ultrasound. If he has significant ascites may benefit from LVAP. Take furosemide 40  mg by mouth every morning. Metabolic 7 in 3 weeks. He may also need EGD and if esophageal varices have become large he will need ending for primary prophylaxis Office visit in 4 months.

## 2013-01-18 NOTE — Patient Instructions (Signed)
You must exercise or walk every day for at least 30 minutes. Continue low-salt diet. Physician will contact with results of ultrasound.

## 2013-01-21 ENCOUNTER — Ambulatory Visit (HOSPITAL_COMMUNITY)
Admission: RE | Admit: 2013-01-21 | Discharge: 2013-01-21 | Disposition: A | Discharge status: Home / Self Care | Payer: Medicare Other | Source: Ambulatory Visit | Attending: Internal Medicine | Admitting: Internal Medicine

## 2013-01-21 DIAGNOSIS — K7460 Unspecified cirrhosis of liver: Secondary | ICD-10-CM

## 2013-01-21 DIAGNOSIS — R161 Splenomegaly, not elsewhere classified: Secondary | ICD-10-CM | POA: Insufficient documentation

## 2013-01-21 DIAGNOSIS — R188 Other ascites: Secondary | ICD-10-CM | POA: Insufficient documentation

## 2013-01-21 DIAGNOSIS — R935 Abnormal findings on diagnostic imaging of other abdominal regions, including retroperitoneum: Secondary | ICD-10-CM | POA: Insufficient documentation

## 2013-01-26 ENCOUNTER — Other Ambulatory Visit (INDEPENDENT_AMBULATORY_CARE_PROVIDER_SITE_OTHER): Payer: Self-pay | Admitting: Internal Medicine

## 2013-01-26 DIAGNOSIS — K7689 Other specified diseases of liver: Secondary | ICD-10-CM

## 2013-01-26 DIAGNOSIS — K7460 Unspecified cirrhosis of liver: Secondary | ICD-10-CM

## 2013-01-31 ENCOUNTER — Ambulatory Visit (HOSPITAL_COMMUNITY)
Admission: RE | Admit: 2013-01-31 | Discharge: 2013-01-31 | Disposition: A | Discharge status: Home / Self Care | Payer: Medicare Other | Source: Ambulatory Visit | Attending: Internal Medicine | Admitting: Internal Medicine

## 2013-01-31 DIAGNOSIS — K766 Portal hypertension: Secondary | ICD-10-CM | POA: Insufficient documentation

## 2013-01-31 DIAGNOSIS — I714 Abdominal aortic aneurysm, without rupture, unspecified: Secondary | ICD-10-CM | POA: Insufficient documentation

## 2013-01-31 DIAGNOSIS — K7689 Other specified diseases of liver: Secondary | ICD-10-CM

## 2013-01-31 DIAGNOSIS — R599 Enlarged lymph nodes, unspecified: Secondary | ICD-10-CM | POA: Insufficient documentation

## 2013-01-31 DIAGNOSIS — K7460 Unspecified cirrhosis of liver: Secondary | ICD-10-CM

## 2013-01-31 MED ORDER — IOHEXOL 300 MG/ML  SOLN
80.0000 mL | Freq: Once | INTRAMUSCULAR | Status: AC | PRN
  Administered 2013-01-31: 80 mL via INTRAVENOUS

## 2013-02-24 ENCOUNTER — Encounter (INDEPENDENT_AMBULATORY_CARE_PROVIDER_SITE_OTHER): Payer: Self-pay

## 2013-04-14 ENCOUNTER — Other Ambulatory Visit: Payer: Medicare Other

## 2013-04-14 ENCOUNTER — Ambulatory Visit: Payer: Medicare Other | Admitting: Neurosurgery

## 2013-04-15 ENCOUNTER — Other Ambulatory Visit (INDEPENDENT_AMBULATORY_CARE_PROVIDER_SITE_OTHER): Payer: Medicare Other | Admitting: *Deleted

## 2013-04-15 ENCOUNTER — Encounter (INDEPENDENT_AMBULATORY_CARE_PROVIDER_SITE_OTHER): Payer: Medicare Other | Admitting: *Deleted

## 2013-04-15 DIAGNOSIS — I6529 Occlusion and stenosis of unspecified carotid artery: Secondary | ICD-10-CM

## 2013-04-15 DIAGNOSIS — I714 Abdominal aortic aneurysm, without rupture: Secondary | ICD-10-CM

## 2013-04-21 ENCOUNTER — Other Ambulatory Visit: Payer: Self-pay | Admitting: *Deleted

## 2013-04-21 DIAGNOSIS — I714 Abdominal aortic aneurysm, without rupture: Secondary | ICD-10-CM

## 2013-04-22 ENCOUNTER — Encounter: Payer: Self-pay | Admitting: Vascular Surgery

## 2013-06-06 ENCOUNTER — Encounter (INDEPENDENT_AMBULATORY_CARE_PROVIDER_SITE_OTHER): Payer: Self-pay | Admitting: Internal Medicine

## 2013-06-06 ENCOUNTER — Ambulatory Visit (INDEPENDENT_AMBULATORY_CARE_PROVIDER_SITE_OTHER): Payer: Medicare Other | Admitting: Internal Medicine

## 2013-06-06 VITALS — BP 110/68 | HR 80 | Temp 97.6°F | Resp 18 | Ht 68.0 in | Wt 171.5 lb

## 2013-06-06 DIAGNOSIS — R188 Other ascites: Secondary | ICD-10-CM

## 2013-06-06 DIAGNOSIS — K7460 Unspecified cirrhosis of liver: Secondary | ICD-10-CM

## 2013-06-06 DIAGNOSIS — R6881 Early satiety: Secondary | ICD-10-CM

## 2013-06-06 DIAGNOSIS — R634 Abnormal weight loss: Secondary | ICD-10-CM

## 2013-06-06 LAB — CBC
HCT: 32 % — ABNORMAL LOW (ref 39.0–52.0)
Hemoglobin: 11.2 g/dL — ABNORMAL LOW (ref 13.0–17.0)
MCH: 33.5 pg (ref 26.0–34.0)
MCHC: 35 g/dL (ref 30.0–36.0)

## 2013-06-06 LAB — COMPREHENSIVE METABOLIC PANEL
ALT: 23 U/L (ref 0–53)
Alkaline Phosphatase: 106 U/L (ref 39–117)
CO2: 25 mEq/L (ref 19–32)
Sodium: 140 mEq/L (ref 135–145)
Total Bilirubin: 2.1 mg/dL — ABNORMAL HIGH (ref 0.3–1.2)
Total Protein: 6.1 g/dL (ref 6.0–8.3)

## 2013-06-06 MED ORDER — FUROSEMIDE 40 MG PO TABS: 20.0000 mg | ORAL_TABLET | Freq: Every day | ORAL | Status: DC

## 2013-06-06 NOTE — Patient Instructions (Signed)
Physician will contact her with results of blood work. EGD and colonoscopy to be scheduled.

## 2013-06-06 NOTE — Progress Notes (Signed)
Presenting complaint;  Followup for chronic liver disease. Patient complains of weight loss and early satiety.  Subjective:  Patient is 72 year old Caucasian male who was cirrhosis secondary to NAFLD complicated by ascites who was last seen on 01/18/2013. Patient reports that he has lost 12 pounds in the last 4-5 weeks. He has noted early satiety. He gets filled up with half of his usual meal. He has not experienced heartburn nausea or vomiting. She also denies abdominal pain or distention. He does not take NSAIDs. He has occasional hematochezia felt to be secondary hemorrhoids. He denies frank rectal bleeding or melena. His bowels are irregular. He has diarrhea and/or constipation and at other times he has normal stools. He denies fever chills or night sweats. He was seen by Dr. Renard Matter earlier this month and noted to have elevated uric acid level and he was begun on allopurinol.  Current Medications: Current Outpatient Prescriptions  Medication Sig Dispense Refill  . allopurinol (ZYLOPRIM) 100 MG tablet Take 100 mg by mouth daily.      Marland Kitchen aspirin 81 MG tablet Take 81 mg by mouth daily.      . ciprofloxacin (CIPRO) 500 MG tablet Take 1 tablet (500 mg total) by mouth daily.  30 tablet  5  . doxazosin (CARDURA) 4 MG tablet Take 4 mg by mouth at bedtime.        . furosemide (LASIX) 40 MG tablet Take 1 tablet (40 mg total) by mouth daily. 5 days a week  30 tablet  5  . multivitamin (THERAGRAN) per tablet Take 1 tablet by mouth daily.        . ondansetron (ZOFRAN) 4 MG tablet Take 4 mg by mouth as needed.       . pantoprazole (PROTONIX) 40 MG tablet TAKE ONE TABLET BY MOUTH IN THE MORNING  30 tablet  11  . propranolol (INDERAL) 20 MG tablet Take 1 tablet (20 mg total) by mouth 2 (two) times daily.  60 tablet  5  . spironolactone (ALDACTONE) 50 MG tablet Take 50 mg by mouth daily.        . vitamin B-12 (CYANOCOBALAMIN) 1000 MCG tablet Take 1,000 mcg by mouth daily.        . [DISCONTINUED] nadolol  (CORGARD) 40 MG tablet Take 40 mg by mouth daily.         No current facility-administered medications for this visit.     Objective: Blood pressure 110/68, pulse 80, temperature 97.6 F (36.4 C), temperature source Oral, resp. rate 18, height 5\' 8"  (1.727 m), weight 171 lb 8 oz (77.792 kg). Patient is alert and does not have asterixis. Conjunctiva is pink. Sclera is nonicteric Oropharyngeal mucosa is normal. No neck masses or thyromegaly noted. Cardiac exam with regular rhythm normal S1 and S2. No murmur or gallop noted. Lungs are clear to auscultation. Abdomen is full. Bowel sounds are normal. Abdomen is soft and nontender. Liver edge is 3 cm below RCM at end inspiration. Spleen is nonpalpable. Shifting dullness is present.  He has 1-2+ pitting edema involving his right leg and trace around edema left ankle.  Labs/studies Results: Lab data from 05/16/2013. Electrolytes within normal limits. BUN 19, creatinine 1.79 and calcium 8.6 Total bilirubin 1.8, direct 0.6, AP 97, AST 35, ALT 20, total protein 6.3 and albumin 3.3 Uric acid 12.8. Serum creatinine was 1.53 on 01/12/2013. AFP was 5.8 on 01/12/2013. Also abdominal pelvic CT was on 02/02/2013 reeling cirrhotic appearing liver without focal abnormalities. Evidence of cholecystectomy and left nephrectomy.  Enlarged upper abdominal lymph nodes and ascites across upper abdomen.   Assessment:  #1. Cirrhosis complicated by ascites. Clinically he still has some ascites but his abdomen is not tense. Serum creatinine has increased in the last 4 months. He is up-to-date on his screening for HCC. #2. Early satiety with weight loss. He has lost 16 pounds since his last visit of March 2014 which is worrisome. He has history of peptic ulcer disease. His last EGD was in January 2012. #3. Patient has average risk for CRC. Colonoscopy was attempted back and generally 2012 but you could not tolerate the preparation therefore procedure could not be  performed.    Plan:  Decrease furosemide to 20 mg by mouth every morning. Patient will go to the lab for CBC, comprehensive chemistry panel and INR. Diagnostic EGD and screening colonoscopy to be scheduled. Office visit in 3 months.

## 2013-06-07 ENCOUNTER — Telehealth (INDEPENDENT_AMBULATORY_CARE_PROVIDER_SITE_OTHER): Payer: Self-pay | Admitting: *Deleted

## 2013-06-07 ENCOUNTER — Other Ambulatory Visit (INDEPENDENT_AMBULATORY_CARE_PROVIDER_SITE_OTHER): Payer: Self-pay | Admitting: Internal Medicine

## 2013-06-07 NOTE — Telephone Encounter (Signed)
I have spoke with Scarlette Calico at [facility], and this information has been given to Dr.Rehman.

## 2013-06-07 NOTE — Telephone Encounter (Signed)
Alert value platelets low at 49 repeated and verified

## 2013-06-08 ENCOUNTER — Telehealth (INDEPENDENT_AMBULATORY_CARE_PROVIDER_SITE_OTHER): Payer: Self-pay | Admitting: *Deleted

## 2013-06-08 ENCOUNTER — Other Ambulatory Visit (INDEPENDENT_AMBULATORY_CARE_PROVIDER_SITE_OTHER): Payer: Self-pay | Admitting: *Deleted

## 2013-06-08 DIAGNOSIS — Z1211 Encounter for screening for malignant neoplasm of colon: Secondary | ICD-10-CM

## 2013-06-08 DIAGNOSIS — R634 Abnormal weight loss: Secondary | ICD-10-CM

## 2013-06-08 DIAGNOSIS — K7460 Unspecified cirrhosis of liver: Secondary | ICD-10-CM

## 2013-06-08 DIAGNOSIS — R6881 Early satiety: Secondary | ICD-10-CM

## 2013-06-08 NOTE — Telephone Encounter (Signed)
Patient needs movi prep 

## 2013-06-10 MED ORDER — PEG-KCL-NACL-NASULF-NA ASC-C 100 G PO SOLR: 1.0000 | Freq: Once | ORAL | Status: DC

## 2013-06-15 ENCOUNTER — Encounter (INDEPENDENT_AMBULATORY_CARE_PROVIDER_SITE_OTHER): Payer: Self-pay | Admitting: *Deleted

## 2013-06-15 NOTE — Telephone Encounter (Signed)
This encounter was created in error - please disregard.

## 2013-06-16 ENCOUNTER — Encounter (HOSPITAL_COMMUNITY): Payer: Self-pay | Admitting: Pharmacy Technician

## 2013-06-24 ENCOUNTER — Telehealth (INDEPENDENT_AMBULATORY_CARE_PROVIDER_SITE_OTHER): Payer: Self-pay | Admitting: *Deleted

## 2013-06-24 ENCOUNTER — Other Ambulatory Visit (INDEPENDENT_AMBULATORY_CARE_PROVIDER_SITE_OTHER): Payer: Self-pay | Admitting: Internal Medicine

## 2013-06-24 DIAGNOSIS — K7460 Unspecified cirrhosis of liver: Secondary | ICD-10-CM

## 2013-06-24 MED ORDER — FUROSEMIDE 20 MG PO TABS: 20.0000 mg | ORAL_TABLET | Freq: Every day | ORAL | Status: DC

## 2013-06-24 NOTE — Telephone Encounter (Signed)
Talked with  Patient He has gained more than 10 pounds since his last visit. Lasix was discontinued 2 weeks ago because of azotemia. Patient advised to stop Aldactone. Will arrange for LVAP next week along with albumin infusion of 50 g and he will also have metabolic 7 day of tap.

## 2013-06-24 NOTE — Telephone Encounter (Signed)
Dr. Karilyn Cota, Charles Hull says his weight is 178 this morning. I think u took him off the Lasix. He says his belly is swelling. I know his renal functions are off.

## 2013-06-24 NOTE — Telephone Encounter (Signed)
Charles Hull is off the fluid pill and has gain up to 183 lbs. He would like to know what he should do. The return phone number is [phone].

## 2013-06-27 ENCOUNTER — Encounter (INDEPENDENT_AMBULATORY_CARE_PROVIDER_SITE_OTHER): Payer: Self-pay

## 2013-06-27 ENCOUNTER — Other Ambulatory Visit (INDEPENDENT_AMBULATORY_CARE_PROVIDER_SITE_OTHER): Payer: Self-pay | Admitting: Internal Medicine

## 2013-06-27 ENCOUNTER — Telehealth (INDEPENDENT_AMBULATORY_CARE_PROVIDER_SITE_OTHER): Payer: Self-pay | Admitting: *Deleted

## 2013-06-27 DIAGNOSIS — K7460 Unspecified cirrhosis of liver: Secondary | ICD-10-CM

## 2013-06-27 DIAGNOSIS — R188 Other ascites: Secondary | ICD-10-CM

## 2013-06-27 NOTE — Telephone Encounter (Signed)
abd tap sch'd 07/01/13 @ 11:00 (1045), patient aware of tap and to go to [facility] for labs same day

## 2013-06-27 NOTE — Telephone Encounter (Signed)
Per Dr.Rehman the patient is to have this lab done prior to his Tap on Friday.

## 2013-07-01 ENCOUNTER — Other Ambulatory Visit (INDEPENDENT_AMBULATORY_CARE_PROVIDER_SITE_OTHER): Payer: Self-pay | Admitting: Internal Medicine

## 2013-07-01 ENCOUNTER — Ambulatory Visit (HOSPITAL_COMMUNITY)
Admission: RE | Admit: 2013-07-01 | Discharge: 2013-07-01 | Disposition: A | Discharge status: Home / Self Care | Payer: Medicare Other | Source: Ambulatory Visit | Attending: Internal Medicine | Admitting: Internal Medicine

## 2013-07-01 DIAGNOSIS — K7460 Unspecified cirrhosis of liver: Secondary | ICD-10-CM | POA: Insufficient documentation

## 2013-07-01 DIAGNOSIS — R188 Other ascites: Secondary | ICD-10-CM | POA: Insufficient documentation

## 2013-07-01 HISTORY — PX: PARACENTESIS: SHX844

## 2013-07-01 LAB — BASIC METABOLIC PANEL
CO2: 23 mEq/L (ref 19–32)
Chloride: 111 mEq/L (ref 96–112)
Glucose, Bld: 98 mg/dL (ref 70–99)
Sodium: 140 mEq/L (ref 135–145)

## 2013-07-01 LAB — BODY FLUID CELL COUNT WITH DIFFERENTIAL
Lymphs, Fluid: 26 %
Neutrophil Count, Fluid: 7 % (ref 0–25)

## 2013-07-01 MED ORDER — ALBUMIN HUMAN 25 % IV SOLN
INTRAVENOUS | Status: AC
  Administered 2013-07-01: 50 g via INTRAVENOUS
  Filled 2013-07-01: qty 200

## 2013-07-01 MED ORDER — ALBUMIN HUMAN 25 % IV SOLN
50.0000 g | Freq: Once | INTRAVENOUS | Status: AC
  Filled 2013-07-01: qty 200

## 2013-07-01 NOTE — Progress Notes (Signed)
Paracentesis complete no signs of distress. 4000 ml yellow abdominal fluid removed.  

## 2013-07-01 NOTE — Progress Notes (Signed)
Pt for right side paracentesis with Dr. Chestine Spore 1% lidocaine given 6ml by MD. Pt tolerating procedure no signs of distress

## 2013-07-04 LAB — BODY FLUID CULTURE: Gram Stain: NONE SEEN

## 2013-07-06 ENCOUNTER — Ambulatory Visit (HOSPITAL_COMMUNITY)
Admission: RE | Admit: 2013-07-06 | Discharge: 2013-07-06 | Disposition: A | Discharge status: Home / Self Care | Payer: Medicare Other | Source: Ambulatory Visit | Attending: Internal Medicine | Admitting: Internal Medicine

## 2013-07-06 ENCOUNTER — Encounter (HOSPITAL_COMMUNITY)
Admission: RE | Disposition: A | Discharge status: Home / Self Care | Payer: Self-pay | Source: Ambulatory Visit | Attending: Internal Medicine

## 2013-07-06 ENCOUNTER — Encounter (HOSPITAL_COMMUNITY): Payer: Self-pay | Admitting: *Deleted

## 2013-07-06 DIAGNOSIS — R188 Other ascites: Secondary | ICD-10-CM

## 2013-07-06 DIAGNOSIS — R7989 Other specified abnormal findings of blood chemistry: Secondary | ICD-10-CM

## 2013-07-06 DIAGNOSIS — Q2733 Arteriovenous malformation of digestive system vessel: Secondary | ICD-10-CM | POA: Insufficient documentation

## 2013-07-06 DIAGNOSIS — I8500 Esophageal varices without bleeding: Secondary | ICD-10-CM

## 2013-07-06 DIAGNOSIS — K766 Portal hypertension: Secondary | ICD-10-CM

## 2013-07-06 DIAGNOSIS — Z1211 Encounter for screening for malignant neoplasm of colon: Secondary | ICD-10-CM | POA: Insufficient documentation

## 2013-07-06 DIAGNOSIS — K5730 Diverticulosis of large intestine without perforation or abscess without bleeding: Secondary | ICD-10-CM | POA: Insufficient documentation

## 2013-07-06 DIAGNOSIS — R634 Abnormal weight loss: Secondary | ICD-10-CM | POA: Insufficient documentation

## 2013-07-06 DIAGNOSIS — K644 Residual hemorrhoidal skin tags: Secondary | ICD-10-CM | POA: Insufficient documentation

## 2013-07-06 DIAGNOSIS — R6881 Early satiety: Secondary | ICD-10-CM | POA: Insufficient documentation

## 2013-07-06 DIAGNOSIS — D126 Benign neoplasm of colon, unspecified: Secondary | ICD-10-CM | POA: Insufficient documentation

## 2013-07-06 DIAGNOSIS — I8510 Secondary esophageal varices without bleeding: Secondary | ICD-10-CM | POA: Insufficient documentation

## 2013-07-06 DIAGNOSIS — K7460 Unspecified cirrhosis of liver: Secondary | ICD-10-CM

## 2013-07-06 DIAGNOSIS — I10 Essential (primary) hypertension: Secondary | ICD-10-CM | POA: Insufficient documentation

## 2013-07-06 HISTORY — PX: COLONOSCOPY WITH ESOPHAGOGASTRODUODENOSCOPY (EGD): SHX5779

## 2013-07-06 LAB — BASIC METABOLIC PANEL
BUN: 14 mg/dL (ref 6–23)
CO2: 23 mEq/L (ref 19–32)
Calcium: 8.6 mg/dL (ref 8.4–10.5)
Chloride: 110 mEq/L (ref 96–112)
Creatinine, Ser: 1.42 mg/dL — ABNORMAL HIGH (ref 0.50–1.35)

## 2013-07-06 LAB — ANAEROBIC CULTURE: Gram Stain: NONE SEEN

## 2013-07-06 SURGERY — COLONOSCOPY WITH ESOPHAGOGASTRODUODENOSCOPY (EGD)
Anesthesia: Moderate Sedation

## 2013-07-06 MED ORDER — MIDAZOLAM HCL 5 MG/5ML IJ SOLN
INTRAMUSCULAR | Status: AC
  Filled 2013-07-06: qty 10

## 2013-07-06 MED ORDER — BUTAMBEN-TETRACAINE-BENZOCAINE 2-2-14 % EX AERO
INHALATION_SPRAY | CUTANEOUS | Status: DC | PRN
  Administered 2013-07-06: 2 via TOPICAL

## 2013-07-06 MED ORDER — MEPERIDINE HCL 50 MG/ML IJ SOLN
INTRAMUSCULAR | Status: AC
  Filled 2013-07-06: qty 1

## 2013-07-06 MED ORDER — SODIUM CHLORIDE 0.9 % IV SOLN
INTRAVENOUS | Status: DC
  Administered 2013-07-06: 1000 mL via INTRAVENOUS

## 2013-07-06 MED ORDER — MEPERIDINE HCL 50 MG/ML IJ SOLN
INTRAMUSCULAR | Status: DC | PRN
  Administered 2013-07-06: 25 mg via INTRAVENOUS

## 2013-07-06 MED ORDER — STERILE WATER FOR IRRIGATION IR SOLN
Status: DC | PRN
  Administered 2013-07-06: 10:00:00

## 2013-07-06 MED ORDER — LIDOCAINE HCL 2 % EX GEL
CUTANEOUS | Status: AC
  Filled 2013-07-06: qty 30

## 2013-07-06 MED ORDER — MIDAZOLAM HCL 5 MG/5ML IJ SOLN
INTRAMUSCULAR | Status: DC | PRN
  Administered 2013-07-06: 1 mg via INTRAVENOUS
  Administered 2013-07-06 (×2): 2 mg via INTRAVENOUS

## 2013-07-06 NOTE — Op Note (Signed)
EGD PROCEDURE REPORT  PATIENT:  Charles Hull  MR#:  [id number] Birthdate:  01-08-1941, 72 y.o., male Endoscopist:  Dr. Malissa Hippo, MD Referred By:  Dr. Ishmael Holter. 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 He is undergoing EGD primarily for diagnostic purposes and screening colonoscopy. If patient is found to have large esophageal varices these would be banded.            Informed Consent:  The risks, benefits, alternatives & imponderables which include, but are not limited to, bleeding, infection, perforation, drug reaction and potential missed lesion have been reviewed.  The potential for biopsy, lesion removal, esophageal dilation, etc. have also been discussed.  Questions have been answered.  All parties agreeable.  Please see history & physical in medical record for more information.  Medications:  Demerol 50 mg IV Versed 5 mg IV Cetacaine spray topically for oropharyngeal anesthesia  EGD  Description of procedure:  The endoscope was introduced through the mouth and advanced to the second portion of the duodenum without difficulty or limitations. The mucosal surfaces were surveyed very carefully during advancement of the scope and upon withdrawal.  Findings:  Esophagus:  Mucosa of the proximal and middle segment was normal. Distally 3 short columns of esophageal varices noted. One was grade 2 and others grade 1. GEJ:  42 cm Stomach:  Stomach was empty and distended very well with insufflation. Folds in the proximal stomach are unremarkable. There were diffuse diffuse changes of snakeskin and normocytic pattern. No erosions or ulcers are noted. Pyloric channel was patent. Angularis was unremarkable. Fundus and cardia were examined and no apices identified. Duodenum:  Normal bulbar and post  bulbar mucosa.  Therapeutic/Diagnostic Maneuvers Performed:  None  COLONOSCOPY Description of procedure:  After a digital rectal exam was performed, that colonoscope was advanced from the anus through the rectum and colon to the area of the cecum, ileocecal valve and appendiceal orifice. The cecum was deeply intubated. These structures were well-seen and photographed for the record. From the level of the cecum and ileocecal valve, the scope was slowly and cautiously withdrawn. The mucosal surfaces were carefully surveyed utilizing scope tip to flexion to facilitate fold flattening as needed. The scope was pulled down into the rectum where a thorough exam including retroflexion was performed.  Findings:   Prep satisfactory. Edema noted to mucosa of ileocecal valve and ascending colon. 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 Single small doublets limited sigmoid colon. 7-8 mm polyp snared from sigmoid colon. Normal rectal mucosa. Small hemorrhoids below the dentate line along with prominent external skin tags.  Therapeutic/Diagnostic Maneuvers Performed:  See above  Complications:  None  Cecal Withdrawal Time:  21 minutes  Impression:  EGD findings; 3 short columns of esophageal varices. One was grade 2 and others grade 1. Portal gastropathy. No evidence of peptic ulcer disease or pyloric stenosis. 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 Single small diverticulum at sigmoid colon. 7-8 mm polyp snared from distal sigmoid colon. External hemorrhoids and skin tags.  Recommendations:  No aspirin or NSAIDs for 2 weeks. Metabolic 7 to be done today. I will be contacting patient with results of blood work and biopsy.  Deneisha Dade U  07/06/2013 11:10 AM  CC: Dr. Alice Reichert, MD & Dr. Bonnetta Barry ref.  provider found
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
## 2013-07-08 ENCOUNTER — Encounter (HOSPITAL_COMMUNITY): Payer: Self-pay | Admitting: Internal Medicine

## 2013-07-13 ENCOUNTER — Encounter (INDEPENDENT_AMBULATORY_CARE_PROVIDER_SITE_OTHER): Payer: Self-pay | Admitting: *Deleted

## 2013-07-15 ENCOUNTER — Telehealth (INDEPENDENT_AMBULATORY_CARE_PROVIDER_SITE_OTHER): Payer: Self-pay | Admitting: *Deleted

## 2013-07-15 DIAGNOSIS — R188 Other ascites: Secondary | ICD-10-CM

## 2013-07-15 DIAGNOSIS — K7460 Unspecified cirrhosis of liver: Secondary | ICD-10-CM

## 2013-07-15 NOTE — Telephone Encounter (Signed)
Patient was seen in the office on 06/06/13 and he weighed 171 lb 8 oz. He had Paracentesis on 07/01/13 with 4000 mL's of fluid collected. 07/06/13 the patient had both Colonoscopy and EGD. I have talked with the patient and he states he is taking medications as he was told but wanted [place] to know that he is starting to get just like he was, weight today is 185 lbs. Dr.Rehman to be made aware.

## 2013-07-15 NOTE — Telephone Encounter (Signed)
Patient will go back on furosemide 40 mg daily and Spiriva 100 mg daily and to have metabolic 7 next week. Please see Ms. Todd's note from today

## 2013-07-15 NOTE — Telephone Encounter (Signed)
Per Dr.Rehman the patient should increase his Lasix to 40 mg daily, Spirolactone 100 mg daily, he should have a met 7 on next Wednesday or Thursday. If no better will precede with another Tap. Patient called and made aware Lab order completed and faxed to [facility].

## 2013-07-15 NOTE — Telephone Encounter (Signed)
Charles Hull has gain a bunch of weight. His knees and ankles are swollen to where he is back to what he was before fluid was drawn off. Weight is 185 lbs. His return phone number is [phone].

## 2013-07-21 LAB — BASIC METABOLIC PANEL
BUN: 19 mg/dL (ref 6–23)
CO2: 29 mEq/L (ref 19–32)
Calcium: 8.9 mg/dL (ref 8.4–10.5)
Creat: 1.8 mg/dL — ABNORMAL HIGH (ref 0.50–1.35)
Glucose, Bld: 88 mg/dL (ref 70–99)

## 2013-07-25 ENCOUNTER — Other Ambulatory Visit (INDEPENDENT_AMBULATORY_CARE_PROVIDER_SITE_OTHER): Payer: Self-pay | Admitting: Internal Medicine

## 2013-07-29 ENCOUNTER — Telehealth (INDEPENDENT_AMBULATORY_CARE_PROVIDER_SITE_OTHER): Payer: Self-pay | Admitting: *Deleted

## 2013-07-29 DIAGNOSIS — K7460 Unspecified cirrhosis of liver: Secondary | ICD-10-CM

## 2013-07-29 NOTE — Telephone Encounter (Signed)
Per Dr.Rehman the patient will need to have labs drawn. 

## 2013-08-01 LAB — BASIC METABOLIC PANEL
Calcium: 8.9 mg/dL (ref 8.4–10.5)
Creat: 2.08 mg/dL — ABNORMAL HIGH (ref 0.50–1.35)

## 2013-08-01 LAB — ALBUMIN: Albumin: 3.1 g/dL — ABNORMAL LOW (ref 3.5–5.2)

## 2013-08-08 ENCOUNTER — Telehealth (INDEPENDENT_AMBULATORY_CARE_PROVIDER_SITE_OTHER): Payer: Self-pay | Admitting: *Deleted

## 2013-08-08 ENCOUNTER — Other Ambulatory Visit (INDEPENDENT_AMBULATORY_CARE_PROVIDER_SITE_OTHER): Payer: Self-pay | Admitting: Internal Medicine

## 2013-08-08 DIAGNOSIS — R188 Other ascites: Secondary | ICD-10-CM

## 2013-08-08 DIAGNOSIS — K7460 Unspecified cirrhosis of liver: Secondary | ICD-10-CM

## 2013-08-08 NOTE — Telephone Encounter (Signed)
abd tap sch'd 08/12/13 at 900 ([phone]), patient aware

## 2013-08-08 NOTE — Telephone Encounter (Signed)
Patient states his weight is up to 175, feels like he may need to have fluid drawn off -- please advise

## 2013-08-08 NOTE — Telephone Encounter (Signed)
Can schedule patient for LAVP. He will also need 50 g of albumin IV. Patient needs office visit with Dr. Fausto Skillern unless already arranged.

## 2013-08-12 ENCOUNTER — Ambulatory Visit (HOSPITAL_COMMUNITY)
Admission: RE | Admit: 2013-08-12 | Discharge: 2013-08-12 | Disposition: A | Discharge status: Home / Self Care | Payer: Medicare Other | Source: Ambulatory Visit | Attending: Internal Medicine | Admitting: Internal Medicine

## 2013-08-12 DIAGNOSIS — K7460 Unspecified cirrhosis of liver: Secondary | ICD-10-CM | POA: Insufficient documentation

## 2013-08-12 DIAGNOSIS — R188 Other ascites: Secondary | ICD-10-CM | POA: Insufficient documentation

## 2013-08-12 LAB — BODY FLUID CELL COUNT WITH DIFFERENTIAL: Lymphs, Fluid: 20 %

## 2013-08-12 MED ORDER — ALBUMIN HUMAN 25 % IV SOLN: 50.0000 g | Freq: Once | INTRAVENOUS | Status: AC

## 2013-08-12 MED ORDER — ALBUMIN HUMAN 25 % IV SOLN
INTRAVENOUS | Status: AC
  Administered 2013-08-12: 50 g via INTRAVENOUS
  Filled 2013-08-12: qty 200

## 2013-08-12 NOTE — Progress Notes (Signed)
Paracentesis complete no signs of distress. 5350 ml yellow abdominal fluid removed.

## 2013-08-12 NOTE — Procedures (Signed)
PreOperative Dx: Cirrhosis, ascites Postoperative Dx: Cirrhosis, ascites Procedure:   US guided paracentesis Radiologist:  Tyron Russell Anesthesia:  8 ml of 1% lidocaine Specimen:  5350 ml of yellow ascitic fluid EBL:   < 1 ml Complications: None

## 2013-08-16 LAB — BODY FLUID CULTURE
Culture: NO GROWTH
Gram Stain: NONE SEEN

## 2013-08-16 LAB — PATHOLOGIST SMEAR REVIEW

## 2013-08-17 LAB — ANAEROBIC CULTURE

## 2013-08-29 ENCOUNTER — Other Ambulatory Visit (HOSPITAL_COMMUNITY): Payer: Self-pay | Admitting: Nephrology

## 2013-08-29 DIAGNOSIS — N289 Disorder of kidney and ureter, unspecified: Secondary | ICD-10-CM

## 2013-08-31 ENCOUNTER — Telehealth (INDEPENDENT_AMBULATORY_CARE_PROVIDER_SITE_OTHER): Payer: Self-pay | Admitting: *Deleted

## 2013-08-31 ENCOUNTER — Other Ambulatory Visit (INDEPENDENT_AMBULATORY_CARE_PROVIDER_SITE_OTHER): Payer: Self-pay | Admitting: Internal Medicine

## 2013-08-31 DIAGNOSIS — R188 Other ascites: Secondary | ICD-10-CM

## 2013-08-31 DIAGNOSIS — K7460 Unspecified cirrhosis of liver: Secondary | ICD-10-CM

## 2013-08-31 NOTE — Telephone Encounter (Signed)
Patient would like abd tap, feels like he has fluid gathering again -- please advise

## 2013-08-31 NOTE — Telephone Encounter (Signed)
abd tap 09/02/13 @ 10:00 (945), has appt 09/19/13

## 2013-08-31 NOTE — Telephone Encounter (Signed)
Can proceed; Also need OV

## 2013-09-02 ENCOUNTER — Encounter (HOSPITAL_COMMUNITY): Payer: Self-pay

## 2013-09-02 ENCOUNTER — Ambulatory Visit (HOSPITAL_COMMUNITY)
Admission: RE | Admit: 2013-09-02 | Discharge: 2013-09-02 | Disposition: A | Discharge status: Home / Self Care | Payer: Medicare Other | Source: Ambulatory Visit | Attending: Internal Medicine | Admitting: Internal Medicine

## 2013-09-02 DIAGNOSIS — K7460 Unspecified cirrhosis of liver: Secondary | ICD-10-CM

## 2013-09-02 DIAGNOSIS — R188 Other ascites: Secondary | ICD-10-CM | POA: Insufficient documentation

## 2013-09-02 LAB — BODY FLUID CELL COUNT WITH DIFFERENTIAL
Eos, Fluid: 0 %
Lymphs, Fluid: 59 %
Monocyte-Macrophage-Serous Fluid: 37 % — ABNORMAL LOW (ref 50–90)
Neutrophil Count, Fluid: 4 % (ref 0–25)

## 2013-09-02 MED ORDER — ALBUMIN HUMAN 25 % IV SOLN
INTRAVENOUS | Status: AC
  Administered 2013-09-02: 50 g via INTRAVENOUS
  Filled 2013-09-02: qty 200

## 2013-09-02 MED ORDER — ALBUMIN HUMAN 25 % IV SOLN
50.0000 g | Freq: Once | INTRAVENOUS | Status: AC
  Administered 2013-09-02: 50 g via INTRAVENOUS

## 2013-09-02 NOTE — Progress Notes (Signed)
Paracentesis complete no signs of distress. 5380 ml yellow abdominal fluid removed.

## 2013-09-06 LAB — BODY FLUID CULTURE: Culture: NO GROWTH

## 2013-09-07 LAB — ANAEROBIC CULTURE

## 2013-09-15 ENCOUNTER — Ambulatory Visit (HOSPITAL_COMMUNITY)
Admission: RE | Admit: 2013-09-15 | Discharge: 2013-09-15 | Disposition: A | Discharge status: Home / Self Care | Payer: Medicare Other | Source: Ambulatory Visit | Attending: Nephrology | Admitting: Nephrology

## 2013-09-15 DIAGNOSIS — N189 Chronic kidney disease, unspecified: Secondary | ICD-10-CM | POA: Insufficient documentation

## 2013-09-15 DIAGNOSIS — N289 Disorder of kidney and ureter, unspecified: Secondary | ICD-10-CM

## 2013-09-15 DIAGNOSIS — Z905 Acquired absence of kidney: Secondary | ICD-10-CM | POA: Insufficient documentation

## 2013-09-15 DIAGNOSIS — N281 Cyst of kidney, acquired: Secondary | ICD-10-CM | POA: Insufficient documentation

## 2013-09-19 ENCOUNTER — Encounter (INDEPENDENT_AMBULATORY_CARE_PROVIDER_SITE_OTHER): Payer: Self-pay | Admitting: Internal Medicine

## 2013-09-19 ENCOUNTER — Ambulatory Visit (INDEPENDENT_AMBULATORY_CARE_PROVIDER_SITE_OTHER): Payer: Medicare Other | Admitting: Internal Medicine

## 2013-09-19 VITALS — BP 104/66 | HR 72 | Temp 97.4°F | Resp 16 | Ht 68.0 in | Wt 170.3 lb

## 2013-09-19 DIAGNOSIS — Z789 Other specified health status: Secondary | ICD-10-CM

## 2013-09-19 DIAGNOSIS — R188 Other ascites: Secondary | ICD-10-CM

## 2013-09-19 DIAGNOSIS — K7460 Unspecified cirrhosis of liver: Secondary | ICD-10-CM

## 2013-09-19 DIAGNOSIS — Z8719 Personal history of other diseases of the digestive system: Secondary | ICD-10-CM

## 2013-09-19 DIAGNOSIS — D649 Anemia, unspecified: Secondary | ICD-10-CM

## 2013-09-19 NOTE — Patient Instructions (Addendum)
Report to emergency room if you develop black stools again and you feel lightheaded. Physician will contact you with results of blood work.

## 2013-09-19 NOTE — Progress Notes (Signed)
Presenting complaint;  Followup for cirrhotic ascites.  Subjective:  Patient is 72 year old Caucasian male who has history of cirrhosis secondary to NAFLD complicated by ascites and he also had SBP on initial presentation over 5 years ago. He is requiring periodic abdominal paracenteses. Furosemide and Spironolactone was discontinued because of rising serum creatinine levels. He was seen by Dr. Fausto Skillern and had multiple studies and was asked to go back on diuretics. His last abdominal tap was 3 weeks ago. He has not gained any weight since then. He feels his ascites is reaccumulating very slowly. He complains of intermittent fleeting migratory abdominal pain. He does not feel well. He has no appetite. He has intermittent nausea but no vomiting. Bowels move daily or every other day. Last week he noted his stools to be black. Last BM was 2 days ago and his stool was dark brown. He denies posttrial lightheadedness or hematemesis. He does not take any NSAIDs. He underwent diagnostic EGD on 07/06/2013 because of early satiety and weight loss followed by screening colonoscopy. EGD revealed 3 short columns of esophageal varices grade 1 and 2, portal gastropathy but no evidence of peptic ulcer disease. 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 6 mm tubular adenoma was removed from sigmoid colon.   Current Medications: Current Outpatient Prescriptions  Medication Sig Dispense Refill  . allopurinol (ZYLOPRIM) 100 MG tablet Take 100 mg by mouth daily.      . ciprofloxacin (CIPRO) 500 MG tablet TAKE ONE TABLET BY MOUTH ONCE DAILY  30 tablet  5  . doxazosin (CARDURA) 4 MG tablet Take 4 mg by mouth at bedtime.        Marland Kitchen FLUVIRIN PRESERVATIVE FREE SUSP injection       . furosemide (LASIX) 20 MG tablet Take 40 mg by mouth daily.       . pantoprazole (PROTONIX) 40 MG tablet TAKE ONE TABLET BY MOUTH IN THE MORNING  30 tablet  11  . propranolol (INDERAL) 20 MG tablet  Take 1 tablet (20 mg total) by mouth 2 (two) times daily.  60 tablet  5  . spironolactone (ALDACTONE) 50 MG tablet Take 50 mg by mouth daily.      . vitamin B-12 (CYANOCOBALAMIN) 1000 MCG tablet Take 1,000 mcg by mouth daily.        . [DISCONTINUED] nadolol (CORGARD) 40 MG tablet Take 40 mg by mouth daily.         No current facility-administered medications for this visit.     Objective: Blood pressure 104/66, pulse 72, temperature 97.4 F (36.3 C), temperature source Oral, resp. rate 16, height 5\' 8"  (1.727 m), weight 170 lb 4.8 oz (77.248 kg). Patient is alert and does not have asterixis.  He has generalized muscle wasting. Conjunctiva is pink. Sclera is nonicteric Oropharyngeal mucosa is normal. No neck masses or thyromegaly noted. Cardiac exam with regular rhythm normal S1 and S2. No murmur or gallop noted. Lungs are clear to auscultation. Abdomen is full but soft and nontender. Shifting dullness is present. No masses or organomegaly. Rectal examination reveals soft sentinel skin tags. He has dark brown soft stool in the rectal vault which is guaiac positive. He has 1+ pitting edema involving distal one third of both legs.  Labs/studies Results: Lab data from 09/15/2013(done by Dr. Fausto Skillern) WBC 2.6, H&H 8.9 and 26.4 and platelet count 51K BUN 28 and creatinine 2.02. Serum sodium 138, potassium 3.6, chloride 107, CO2 20 and glucose 114. B12 and folate  levels 1491 and 8.2 respectively. Serum ferritin 90. Hepatitis B surface antigen negative. Hepatitis C virus antibody negati  Assessment:  #1. Decompensated cirrhosis secondary to NAFLD. He has developed worsening renal function possibly indicative of progressive liver disease. Nephrology evaluation is underway. He is back on diuretic therapy as recommended by Dr. Fausto Skillern. #2. Progressive anemia. Hemoglobin few months ago was over 11 g and now 8.9. His stool is guaiac-positive and he gives history of melena. He could be  bleeding from upper GI tract, small bowel or proximal colon. He may need repeat EGD.   Plan:   Patient will go to lab for CBC and INR. Patient advised to go to emergency room should he develop melena. Possible EGD in near future. He would also need liver ultrasound in near future.

## 2013-09-20 ENCOUNTER — Telehealth (INDEPENDENT_AMBULATORY_CARE_PROVIDER_SITE_OTHER): Payer: Self-pay | Admitting: *Deleted

## 2013-09-20 ENCOUNTER — Encounter (INDEPENDENT_AMBULATORY_CARE_PROVIDER_SITE_OTHER): Payer: Self-pay | Admitting: *Deleted

## 2013-09-20 DIAGNOSIS — Z8719 Personal history of other diseases of the digestive system: Secondary | ICD-10-CM

## 2013-09-20 DIAGNOSIS — D649 Anemia, unspecified: Secondary | ICD-10-CM

## 2013-09-20 LAB — CBC
HCT: 27.2 % — ABNORMAL LOW (ref 39.0–52.0)
Hemoglobin: 9.2 g/dL — ABNORMAL LOW (ref 13.0–17.0)
MCHC: 33.8 g/dL (ref 30.0–36.0)
MCV: 98.6 fL (ref 78.0–100.0)
Platelets: 55 10*3/uL — ABNORMAL LOW (ref 150–400)
RDW: 16.2 % — ABNORMAL HIGH (ref 11.5–15.5)

## 2013-09-20 LAB — PROTIME-INR
INR: 1.46 (ref <1.50)
Prothrombin Time: 17.5 seconds — ABNORMAL HIGH (ref 11.6–15.2)

## 2013-09-20 NOTE — Telephone Encounter (Signed)
Per Dr.Rehman the patient will need to have labs drawn in 2 weeks. 

## 2013-09-22 ENCOUNTER — Ambulatory Visit: Payer: Medicare Other | Admitting: Vascular Surgery

## 2013-09-22 ENCOUNTER — Other Ambulatory Visit: Payer: Medicare Other

## 2013-09-23 ENCOUNTER — Other Ambulatory Visit (INDEPENDENT_AMBULATORY_CARE_PROVIDER_SITE_OTHER): Payer: Self-pay | Admitting: Internal Medicine

## 2013-09-23 DIAGNOSIS — R188 Other ascites: Secondary | ICD-10-CM

## 2013-09-23 DIAGNOSIS — K7460 Unspecified cirrhosis of liver: Secondary | ICD-10-CM

## 2013-09-27 ENCOUNTER — Ambulatory Visit (HOSPITAL_COMMUNITY)
Admission: RE | Admit: 2013-09-27 | Discharge: 2013-09-27 | Disposition: A | Discharge status: Home / Self Care | Payer: Medicare Other | Source: Ambulatory Visit | Attending: Internal Medicine | Admitting: Internal Medicine

## 2013-09-27 ENCOUNTER — Encounter (HOSPITAL_COMMUNITY): Payer: Self-pay

## 2013-09-27 DIAGNOSIS — K7460 Unspecified cirrhosis of liver: Secondary | ICD-10-CM | POA: Insufficient documentation

## 2013-09-27 DIAGNOSIS — R188 Other ascites: Secondary | ICD-10-CM | POA: Insufficient documentation

## 2013-09-27 LAB — BODY FLUID CELL COUNT WITH DIFFERENTIAL
Eos, Fluid: 0 %
Monocyte-Macrophage-Serous Fluid: 36 % — ABNORMAL LOW (ref 50–90)
Neutrophil Count, Fluid: 4 % (ref 0–25)

## 2013-09-27 MED ORDER — ALBUMIN HUMAN 25 % IV SOLN
50.0000 g | Freq: Once | INTRAVENOUS | Status: AC
  Administered 2013-09-27: 50 g via INTRAVENOUS

## 2013-09-27 MED ORDER — ALBUMIN HUMAN 25 % IV SOLN
INTRAVENOUS | Status: AC
  Administered 2013-09-27: 50 g via INTRAVENOUS
  Filled 2013-09-27: qty 200

## 2013-09-27 NOTE — Progress Notes (Signed)
Paracentesis complete no signs of distress. 6300 ml amber colored abdominal fluid removed

## 2013-09-27 NOTE — Procedures (Signed)
PreOperative Dx: Cirrhosis, ascites Postoperative Dx: Cirrhosis, ascites Procedure:   US guided paracentesis Radiologist:  Tyron Russell Anesthesia:  9 ml of 1% lidocaine Specimen:  6300 ml of clear light yellow ascitic fluid EBL:   < 1 ml Complications: None

## 2013-09-28 LAB — PATHOLOGIST SMEAR REVIEW

## 2013-10-01 LAB — BODY FLUID CULTURE
Culture: NO GROWTH
Gram Stain: NONE SEEN

## 2013-10-02 LAB — ANAEROBIC CULTURE

## 2013-10-27 ENCOUNTER — Other Ambulatory Visit (HOSPITAL_COMMUNITY): Payer: Medicare Other

## 2013-10-27 ENCOUNTER — Other Ambulatory Visit: Payer: Medicare Other

## 2013-10-27 ENCOUNTER — Ambulatory Visit: Payer: Medicare Other | Admitting: Vascular Surgery

## 2013-10-27 ENCOUNTER — Ambulatory Visit: Payer: Medicare Other | Admitting: Family

## 2013-10-28 ENCOUNTER — Ambulatory Visit: Payer: Medicare Other | Admitting: Family

## 2013-10-28 ENCOUNTER — Other Ambulatory Visit (HOSPITAL_COMMUNITY): Payer: Medicare Other

## 2013-11-07 ENCOUNTER — Ambulatory Visit (HOSPITAL_COMMUNITY)
Admission: RE | Admit: 2013-11-07 | Discharge: 2013-11-07 | Disposition: A | Discharge status: Home / Self Care | Payer: Medicare Other | Source: Ambulatory Visit | Attending: Vascular Surgery | Admitting: Vascular Surgery

## 2013-11-07 ENCOUNTER — Ambulatory Visit (HOSPITAL_COMMUNITY)
Admission: RE | Admit: 2013-11-07 | Discharge: 2013-11-07 | Disposition: A | Discharge status: Home / Self Care | Payer: Medicare Other | Source: Ambulatory Visit | Attending: Internal Medicine | Admitting: Internal Medicine

## 2013-11-07 ENCOUNTER — Encounter: Payer: Self-pay | Admitting: Family

## 2013-11-07 ENCOUNTER — Other Ambulatory Visit (INDEPENDENT_AMBULATORY_CARE_PROVIDER_SITE_OTHER): Payer: Self-pay | Admitting: Internal Medicine

## 2013-11-07 ENCOUNTER — Encounter (HOSPITAL_COMMUNITY): Payer: Self-pay

## 2013-11-07 ENCOUNTER — Ambulatory Visit (HOSPITAL_COMMUNITY): Payer: Medicare Other

## 2013-11-07 ENCOUNTER — Other Ambulatory Visit (HOSPITAL_BASED_OUTPATIENT_CLINIC_OR_DEPARTMENT_OTHER): Payer: Medicare Other

## 2013-11-07 ENCOUNTER — Other Ambulatory Visit (HOSPITAL_COMMUNITY): Payer: Self-pay | Admitting: Vascular Surgery

## 2013-11-07 DIAGNOSIS — I714 Abdominal aortic aneurysm, without rupture, unspecified: Secondary | ICD-10-CM | POA: Insufficient documentation

## 2013-11-07 DIAGNOSIS — R188 Other ascites: Secondary | ICD-10-CM | POA: Insufficient documentation

## 2013-11-07 DIAGNOSIS — I6529 Occlusion and stenosis of unspecified carotid artery: Secondary | ICD-10-CM | POA: Insufficient documentation

## 2013-11-07 DIAGNOSIS — I658 Occlusion and stenosis of other precerebral arteries: Secondary | ICD-10-CM | POA: Insufficient documentation

## 2013-11-07 DIAGNOSIS — K7460 Unspecified cirrhosis of liver: Secondary | ICD-10-CM

## 2013-11-07 DIAGNOSIS — I10 Essential (primary) hypertension: Secondary | ICD-10-CM | POA: Insufficient documentation

## 2013-11-07 LAB — BODY FLUID CELL COUNT WITH DIFFERENTIAL
Eos, Fluid: 1 %
Lymphs, Fluid: 45 %
Neutrophil Count, Fluid: 4 % (ref 0–25)
Total Nucleated Cell Count, Fluid: 22 cu mm (ref 0–1000)

## 2013-11-07 MED ORDER — ALBUMIN HUMAN 25 % IV SOLN
INTRAVENOUS | Status: AC
  Administered 2013-11-07: 50 g via INTRAVENOUS
  Filled 2013-11-07: qty 100

## 2013-11-07 MED ORDER — LIDOCAINE HCL (PF) 2 % IJ SOLN
INTRAMUSCULAR | Status: AC
  Filled 2013-11-07: qty 10

## 2013-11-07 MED ORDER — ALBUMIN HUMAN 25 % IV SOLN
50.0000 g | Freq: Once | INTRAVENOUS | Status: AC
  Administered 2013-11-07: 50 g via INTRAVENOUS

## 2013-11-07 NOTE — Progress Notes (Signed)
Paracentesis complete no signs of distress.  

## 2013-11-08 ENCOUNTER — Ambulatory Visit (INDEPENDENT_AMBULATORY_CARE_PROVIDER_SITE_OTHER): Payer: Medicare Other | Admitting: Family

## 2013-11-08 ENCOUNTER — Other Ambulatory Visit (HOSPITAL_COMMUNITY): Payer: Medicare Other

## 2013-11-08 LAB — PATHOLOGIST SMEAR REVIEW

## 2013-11-08 NOTE — Progress Notes (Signed)
Patient did not appear for his appointment today. Our office received a call from his wife who reports that her husband was told by someone at [facility], where the patient had his non-invasive vascular testing yesterday, that since he had the testing there that he did not need to attend today's appointment in our office. However, he should have attended this appointment to discuss the results and treatment options. He will therefore need to make an appointment with Dr. Darrick Penna for the week after next week to discuss the results and his treatment options. Before his appointment he needs to have a repeat CT angio of abdomen/pelvis if renal function is adequate (per Dr. Hart Rochester); however, it is noted that his creatinine has been steadily increasing in the last 5 months, and his last creatinine available in EPIC was 3 months ago at 2.08.  Carotid Duplex from 11/07/13: Progression of the bilateral carotid bifurcation plaque. Right ICA: 188/24 cm/sec, 50-69% stenosis Left ICA: 464/63 cm/sec, 70-99% stenosis  AAA [place] from 11/07/13: The mid abdominal aortic aneurysm measures 5.6 x 4.8 cm, the distal 4.1 x 3.7 cm. The proximal portion of the right CIA is ectatic at 13 mm but difficult to further visualize as is the left IA secondary to overlying bowel gas.  For comparison: The largest AAA measurement in the Fall of 2011 by MRI was 4 cm. By CTA on 01/26/13 the largest AAA measurement was infrarenal at 3.8 cm

## 2013-11-09 ENCOUNTER — Encounter: Payer: Self-pay | Admitting: Vascular Surgery

## 2013-11-11 ENCOUNTER — Telehealth: Payer: Self-pay

## 2013-11-11 ENCOUNTER — Ambulatory Visit: Payer: Self-pay | Admitting: Family Medicine

## 2013-11-11 DIAGNOSIS — I714 Abdominal aortic aneurysm, without rupture, unspecified: Secondary | ICD-10-CM

## 2013-11-11 LAB — BODY FLUID CULTURE
Culture: NO GROWTH
Gram Stain: NONE SEEN

## 2013-11-11 NOTE — Telephone Encounter (Signed)
Called pt. To inform of recommendation by the Nurse Practitioner and Dr. Scot Dock to schedule a non-contrast CT of the Abd and Pelvis for increased size of AAA on recent US at [facility].  Also advised pt. Of Carotid ultrasound indicated increase in narrowing in carotid arteries.  Informed pt. that an office scheduler will call him to schedule appts. for both a CT scan and an office visit with Dr. Oneida Alar, in the near future.  Verb. Understanding.

## 2013-11-11 NOTE — Telephone Encounter (Signed)
Message copied by Denman George on Fri Nov 11, 2013  9:37 AM ------      Message from: Viann Fish      Created: Wed Nov 09, 2013  8:05 PM      Regarding: FW: note from Marilynne Drivers,            Please call patient and let him know that his carotid artery blockage is worse and his AAA is bigger.      The next step in the process is to get a non-contrast CT of his abdomen and pelvis.      His kidney function will not tolerate contrast.      He will then need to meet with Dr. Oneida Alar as soon as the CT is done or as soon as possible to discuss treatment options to reduce his risk of stroke and ruptured aortic aneurysm.      Thank you,      Vinnie Level                  ----- Message -----         From: Angelia Mould, MD         Sent: 11/09/2013   1:39 PM           To: Elam Dutch, MD, Sharmon Leyden Nickel, NP      Subject: note from Vinnie Level                                        I would get non-contrast CT as Radiology will not use contrast given his crt of 2. Then office visit with [facility].      Thanks      CD      ----- Message -----         From: Viann Fish, NP         Sent: 11/08/2013   7:16 PM           To: Angelia Mould, MD      Subject: your thoughts on this one please                         Dr. Scot Dock,            Patient did not appear for his appointment today.      Our office received a call from his wife who reports that her husband was told by someone at [facility], where the patient had his non-invasive vascular testing yesterday, that since he had the testing there that he did not need to attend today's appointment in our office.      However, he should have attended this appointment to discuss the results and treatment options.      He will therefore need to make an appointment with Dr. Oneida Alar for the week after next week to discuss the results and his treatment options.      Before his appointment he needs to have a repeat CT  angio of abdomen/pelvis if renal function is adequate (per Dr. Kellie Simmering); however, it is noted that his creatinine has been steadily increasing in the last 5 months, and his last creatinine available in EPIC was 3 months ago at 2.08.            Carotid Duplex from 11/07/13:  Progression of the bilateral carotid bifurcation plaque.      Right ICA: 188/24 cm/sec, 50-69% stenosis      Left ICA: 464/63 cm/sec, 70-99% stenosis            AAA [place] from 11/07/13:      The mid abdominal aortic aneurysm measures 5.6 x 4.8 cm, the distal 4.1 x 3.7 cm.      The proximal portion of the right CIA is ectatic at 13 mm but difficult to further visualize as is the left IA secondary to overlying bowel gas.            For comparison:      The largest AAA measurement in the Fall of 2011 by MRI was 4 cm.      By CTA on 01/26/13 the largest AAA measurement was infrarenal at 3.8 cm.            Should he have a non-contrast CT to re-evaluate the AAA, or just schedule him to see Dr. Oneida Alar in 2 weeks re the options to address the AAA and the left ICA high grade stenosis?            Thanks, Vinnie Level                   ------

## 2013-11-12 LAB — ANAEROBIC CULTURE: Gram Stain: NONE SEEN

## 2013-11-14 ENCOUNTER — Encounter: Payer: Self-pay | Admitting: Family Medicine

## 2013-11-14 ENCOUNTER — Ambulatory Visit (INDEPENDENT_AMBULATORY_CARE_PROVIDER_SITE_OTHER): Payer: Medicare HMO | Admitting: Family Medicine

## 2013-11-14 VITALS — BP 116/61 | HR 91 | Temp 97.1°F | Ht 68.0 in | Wt 158.0 lb

## 2013-11-14 DIAGNOSIS — K7460 Unspecified cirrhosis of liver: Secondary | ICD-10-CM

## 2013-11-14 LAB — POCT CBC
Granulocyte percent: 78.9 %G (ref 37–80)
HCT, POC: 30.6 % — AB (ref 43.5–53.7)
Hemoglobin: 9.8 g/dL — AB (ref 14.1–18.1)
LYMPH, POC: 0.5 — AB (ref 0.6–3.4)
MCH: 31.4 pg — AB (ref 27–31.2)
MCHC: 32.1 g/dL (ref 31.8–35.4)
MCV: 97.8 fL — AB (ref 80–97)
MPV: 7.3 fL (ref 0–99.8)
PLATELET COUNT, POC: 51 10*3/uL — AB (ref 142–424)
POC Granulocyte: 2.8 (ref 2–6.9)
POC LYMPH PERCENT: 13.9 %L (ref 10–50)
RBC: 3.1 M/uL — AB (ref 4.69–6.13)
RDW, POC: 16.2 %
WBC: 3.6 10*3/uL — AB (ref 4.6–10.2)
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
## 2013-11-15 LAB — COMPREHENSIVE METABOLIC PANEL
ALK PHOS: 122 IU/L — AB (ref 39–117)
ALT: 18 IU/L (ref 0–44)
AST: 35 IU/L (ref 0–40)
Albumin/Globulin Ratio: 1.2 (ref 1.1–2.5)
Albumin: 3.3 g/dL — ABNORMAL LOW (ref 3.5–4.8)
BUN/Creatinine Ratio: 13 (ref 10–22)
BUN: 28 mg/dL — ABNORMAL HIGH (ref 8–27)
CHLORIDE: 104 mmol/L (ref 97–108)
CO2: 15 mmol/L — AB (ref 18–29)
Calcium: 8.7 mg/dL (ref 8.6–10.2)
Creatinine, Ser: 2.11 mg/dL — ABNORMAL HIGH (ref 0.76–1.27)
GFR calc Af Amer: 35 mL/min/{1.73_m2} — ABNORMAL LOW (ref >59)
GFR calc non Af Amer: 31 mL/min/{1.73_m2} — ABNORMAL LOW (ref >59)
Globulin, Total: 2.8 g/dL (ref 1.5–4.5)
Glucose: 161 mg/dL — ABNORMAL HIGH (ref 65–99)
POTASSIUM: 3.5 mmol/L (ref 3.5–5.2)
SODIUM: 142 mmol/L (ref 134–144)
Total Bilirubin: 1.9 mg/dL — ABNORMAL HIGH (ref 0.0–1.2)
Total Protein: 6.1 g/dL (ref 6.0–8.5)

## 2013-11-21 ENCOUNTER — Other Ambulatory Visit (INDEPENDENT_AMBULATORY_CARE_PROVIDER_SITE_OTHER): Payer: Self-pay | Admitting: Internal Medicine

## 2013-11-21 DIAGNOSIS — R188 Other ascites: Secondary | ICD-10-CM

## 2013-11-21 DIAGNOSIS — K7460 Unspecified cirrhosis of liver: Secondary | ICD-10-CM

## 2013-11-22 ENCOUNTER — Encounter (INDEPENDENT_AMBULATORY_CARE_PROVIDER_SITE_OTHER): Payer: Self-pay | Admitting: Internal Medicine

## 2013-11-22 ENCOUNTER — Encounter (HOSPITAL_COMMUNITY): Payer: Self-pay

## 2013-11-22 ENCOUNTER — Ambulatory Visit (HOSPITAL_COMMUNITY)
Admission: RE | Admit: 2013-11-22 | Discharge: 2013-11-22 | Disposition: A | Discharge status: Home / Self Care | Payer: Medicare HMO | Source: Ambulatory Visit | Attending: Internal Medicine | Admitting: Internal Medicine

## 2013-11-22 ENCOUNTER — Ambulatory Visit (INDEPENDENT_AMBULATORY_CARE_PROVIDER_SITE_OTHER): Payer: Medicare HMO | Admitting: Internal Medicine

## 2013-11-22 VITALS — BP 122/58 | HR 80 | Temp 96.6°F | Resp 20 | Ht 68.0 in | Wt 150.2 lb

## 2013-11-22 DIAGNOSIS — K7460 Unspecified cirrhosis of liver: Secondary | ICD-10-CM | POA: Insufficient documentation

## 2013-11-22 DIAGNOSIS — R634 Abnormal weight loss: Secondary | ICD-10-CM

## 2013-11-22 DIAGNOSIS — R188 Other ascites: Secondary | ICD-10-CM

## 2013-11-22 DIAGNOSIS — D649 Anemia, unspecified: Secondary | ICD-10-CM

## 2013-11-22 LAB — BODY FLUID CELL COUNT WITH DIFFERENTIAL
Eos, Fluid: 0 %
LYMPHS FL: 23 %
Monocyte-Macrophage-Serous Fluid: 69 % (ref 50–90)
Neutrophil Count, Fluid: 8 % (ref 0–25)
Other Cells, Fluid: 0 %
Total Nucleated Cell Count, Fluid: 27 cu mm (ref 0–1000)

## 2013-11-22 MED ORDER — ALBUMIN HUMAN 25 % IV SOLN
50.0000 g | Freq: Once | INTRAVENOUS | Status: AC
  Administered 2013-11-22: 50 g via INTRAVENOUS

## 2013-11-22 MED ORDER — LACTULOSE 10 GM/15ML PO SOLN: 20.0000 g | Freq: Every day | ORAL | Status: DC

## 2013-11-22 MED ORDER — MEGESTROL ACETATE 400 MG/10ML PO SUSP: 200.0000 mg | Freq: Every day | ORAL | Status: DC

## 2013-11-22 MED ORDER — ALBUMIN HUMAN 25 % IV SOLN
INTRAVENOUS | Status: AC
  Administered 2013-11-22: 13:00:00 50 g via INTRAVENOUS
  Filled 2013-11-22: qty 200

## 2013-11-22 NOTE — Progress Notes (Signed)
Presenting complaint;  Followup for cirrhosis, ascites and weight loss.  Database;  Patient is 73 year old Caucasian male who was diagnosed with cirrhosis in June, 2009 at the time of laparoscopic cholecystectomy. Workup was negative and cirrhosis was felt to be secondary to NAFLD. He did fine until August 2011 when he presented for spontaneous bacterial peritonitis(MMH). He was doing fine when I saw him back in March 2014. He presented in July 2014 with weight loss and anorexia. He underwent EGD and colonoscopy on 07/06/2013. EGD revealed 3 short columns of esophageal varices one was grade 2 and other stool a grade 1. He also had portal gastropathy. Colonoscopy reveals 3 small AV malformations in ascending colon and 7-8 mm polyp was snared from sigmoid colon and found her to be tubular adenoma. He also had external hemorrhoids and skin tags. His ascites has been difficult to control with diuretic therapy resulting in azotemia and he has required periodic abdominal taps. Between August last year and today he is at 6 taps. He had 5600 mL of clear ascitic fluid removed today. Cell count reveals 61 WBCs. Nephrology consultation was obtained with Dr. Hinda Lenis and no structural abnormality noted to right kidney. He donated left kidney to his now deceased daughter several years ago.  Subjective;  Patient is here for scheduled visit accompanied by his wife. He does not feel well. He has lost 20 pounds since his last visit 2 months ago. He has poor appetite. He does not have nausea and/or vomiting. He was given low-dose Megace by Dr. Hinda Lenis without improvement in his diet. He feels weak. He has sporadic right mid abdominal pain but it does not last long. He also complains of intermittent fecal incontinence. Stool generally is soft. Stool is stopped since he has been on iron. He denies frank rectal bleeding. He denies fever chills or night sweats. He also has enlarging AAA and is scheduled to undergo  abdominopelvic CT without contrast later this month. His wife has not noted him to be confused.  Current Medications: Current Outpatient Prescriptions  Medication Sig Dispense Refill  . allopurinol (ZYLOPRIM) 100 MG tablet Take 100 mg by mouth daily.      . Cholecalciferol (D3-1000) 1000 UNITS capsule Take 1,000 Units by mouth daily.      . ciprofloxacin (CIPRO) 500 MG tablet TAKE ONE TABLET BY MOUTH ONCE DAILY  30 tablet  5  . doxazosin (CARDURA) 4 MG tablet Take 4 mg by mouth at bedtime.        . furosemide (LASIX) 20 MG tablet Take 40 mg by mouth daily.       . NON FORMULARY IRON 45 MG  SLOW RELEASE     ONE TABLET DAILY      . pantoprazole (PROTONIX) 40 MG tablet TAKE ONE TABLET BY MOUTH IN THE MORNING  30 tablet  11  . propranolol (INDERAL) 20 MG tablet Take 1 tablet (20 mg total) by mouth 2 (two) times daily.  60 tablet  5  . vitamin B-12 (CYANOCOBALAMIN) 1000 MCG tablet Take 1,000 mcg by mouth daily.        Marland Kitchen lactulose (CHRONULAC) 10 GM/15ML solution Take 30 mLs (20 g total) by mouth daily.  960 mL  5  . megestrol (MEGACE) 400 MG/10ML suspension Take 5 mLs (200 mg total) by mouth daily.  240 mL  1  . [DISCONTINUED] nadolol (CORGARD) 40 MG tablet Take 40 mg by mouth daily.         No current facility-administered medications for  this visit.     Objective: Blood pressure 122/58, pulse 80, temperature 96.6 F (35.9 C), temperature source Oral, resp. rate 20, height 5\' 8"  (1.727 m), weight 150 lb 3.2 oz (68.13 kg). Patient is alert but in no acute distress. He has bitemporal wasting. He does not have asterixis. Conjunctiva is pink. Sclera is nonicteric Oropharyngeal mucosa is normal. No neck masses or thyromegaly noted. Cardiac exam with regular rhythm normal S1 and S2. No murmur or gallop noted. Lungs are clear to auscultation. Abdomen is full. He has bandage over puncture site at left mid abdomen. Abdomen is soft and nontender. Liver or spleen is not palpable. Rectal  examination reveals dark stool which is faintly heme positive. No scrotal edema noted. No LE edema or clubbing noted.    Assessment:  #1. Anorexia and weight loss. Not sure if these symptoms are secondary to decompensated cirrhosis there is an occult process. Last abdominopelvic CT was in March 2014 and no abnormality was noted. He could certainly have Rupert not picked up on ultrasound. Anorexia may also be secondary to renal dysfunction. #2. Cirrhosis secondary to NAFLD. He has developed ascites which has been refractory to therapy and is requiring periodic abdominal paracenteses. TIPS would be an option if the renal dysfunction is felt to be secondary to liver disease and not a primary process. #3. Poor colonic evacuation and fecal incontinence.    Plan:  Increase Megace to 200 mg by mouth daily. Fiber supplement 3-4 g by mouth each bedtime. Lactulose 15-30 mL by mouth each bedtime. Patient will go to lab for CBC, metabolic 7, AFP and INR. Will discuss with Dr.Befakadu as to the nature of renal dysfunction and to determine if he could be evaluated for TIPS. Will also review abdominopelvic CT which has been scheduled by Dr. Juanda Crumble feels for evaluation of enlarging AAA. Office visit in 8 weeks.

## 2013-11-22 NOTE — Procedures (Signed)
PreOperative Dx: Cirrhosis, ascites Postoperative Dx: Cirrhosis, ascites Procedure:   US guided paracentesis Radiologist:  Thornton Papas Anesthesia:  9 ml of 1% lidocaine Specimen:  5600 ml of clear yellow ascitic fluid EBL:   < 1 ml Complications: None

## 2013-11-22 NOTE — Progress Notes (Signed)
Paracentesis complete no signs of distress. 5200 ml yellow abdominal fluid removed. 71ml lidocaine 1% given by MD

## 2013-11-22 NOTE — Patient Instructions (Addendum)
Physician will contact her with results of blood work and further recommendations Lactulose 1-2 tablespoonful by mouth daily at bedtime

## 2013-11-23 ENCOUNTER — Other Ambulatory Visit: Payer: Self-pay | Admitting: *Deleted

## 2013-11-23 ENCOUNTER — Encounter: Payer: Self-pay | Admitting: Vascular Surgery

## 2013-11-23 LAB — CBC
HEMATOCRIT: 27.5 % — AB (ref 39.0–52.0)
Hemoglobin: 9.1 g/dL — ABNORMAL LOW (ref 13.0–17.0)
MCH: 32.3 pg (ref 26.0–34.0)
MCHC: 33.1 g/dL (ref 30.0–36.0)
MCV: 97.5 fL (ref 78.0–100.0)
Platelets: 43 10*3/uL — ABNORMAL LOW (ref 150–400)
RBC: 2.82 MIL/uL — ABNORMAL LOW (ref 4.22–5.81)
RDW: 16.3 % — ABNORMAL HIGH (ref 11.5–15.5)
WBC: 2.1 10*3/uL — ABNORMAL LOW (ref 4.0–10.5)

## 2013-11-23 LAB — BASIC METABOLIC PANEL
BUN: 30 mg/dL — AB (ref 6–23)
CO2: 22 mEq/L (ref 19–32)
Calcium: 9.2 mg/dL (ref 8.4–10.5)
Chloride: 108 mEq/L (ref 96–112)
Creat: 1.83 mg/dL — ABNORMAL HIGH (ref 0.50–1.35)
Glucose, Bld: 113 mg/dL — ABNORMAL HIGH (ref 70–99)
POTASSIUM: 3.5 meq/L (ref 3.5–5.3)
Sodium: 142 mEq/L (ref 135–145)

## 2013-11-23 LAB — PROTIME-INR
INR: 1.64 — AB (ref <1.50)
PROTHROMBIN TIME: 19.1 s — AB (ref 11.6–15.2)

## 2013-11-23 LAB — AFP TUMOR MARKER: AFP TUMOR MARKER: 3.2 ng/mL (ref 0.0–8.0)

## 2013-11-23 LAB — PATHOLOGIST SMEAR REVIEW

## 2013-11-24 ENCOUNTER — Ambulatory Visit
Admission: RE | Admit: 2013-11-24 | Discharge: 2013-11-24 | Disposition: A | Discharge status: Home / Self Care | Payer: Medicare HMO | Source: Ambulatory Visit | Attending: Vascular Surgery | Admitting: Vascular Surgery

## 2013-11-24 ENCOUNTER — Ambulatory Visit (INDEPENDENT_AMBULATORY_CARE_PROVIDER_SITE_OTHER): Payer: Medicare HMO | Admitting: Vascular Surgery

## 2013-11-24 ENCOUNTER — Encounter: Payer: Self-pay | Admitting: Vascular Surgery

## 2013-11-24 VITALS — BP 117/68 | HR 85 | Ht 68.0 in | Wt 153.9 lb

## 2013-11-24 DIAGNOSIS — I714 Abdominal aortic aneurysm, without rupture, unspecified: Secondary | ICD-10-CM | POA: Insufficient documentation

## 2013-11-24 DIAGNOSIS — I6529 Occlusion and stenosis of unspecified carotid artery: Secondary | ICD-10-CM

## 2013-11-24 NOTE — Addendum Note (Signed)
Addended by: Mena Goes on: 11/24/2013 04:34 PM   Modules accepted: Orders

## 2013-11-24 NOTE — Progress Notes (Signed)
Established Carotid Patient  History of Present Illness  Charles Hull is a 73 y.o. (10-29-1941) male who presents for follow up of carotid stenosis and AAA.  He is seen every 6 months. Recent ultrasound suggested his aneurysm had been growing in size. He returns today after CT scan of the abdomen and pelvis for more precise determination of aneurysm.    Patient has no history of TIA or stroke symptom.  The patient has not had amaurosis fugax or monocular blindness.  The patient has not had facial drooping or hemiplegia.  The patient has not had receptive or expressive aphasia.  He reports no abdominal pain, and no back pain.     The patient's PMH, PSH, SH, FamHx, Med, and Allergies are unchanged from . 10/14/12.  He is taking an appetite stimulant.  He has lost 50 pounds over the past year due to his liver disease.  On ROS today:Review of Systems  Constitutional: Positive for weight loss.  HENT: Negative for ear pain and sore throat.   Eyes: Negative for blurred vision and pain.  Cardiovascular: Positive for leg swelling. Negative for chest pain and palpitations.  Gastrointestinal: Negative for heartburn, nausea and vomiting.  Genitourinary: Negative for dysuria and flank pain.  Musculoskeletal: Negative for back pain.  Neurological: Positive for weakness. Negative for dizziness, tremors and headaches.  Endo/Heme/Allergies: Does not bruise/bleed easily.  Psychiatric/Behavioral: Negative for memory loss.     Physical Examination  Filed Vitals:   11/24/13 1414  BP: 117/68  Pulse: 85  Height: 5\' 8"  (1.727 m)  Weight: 153 lb 14.4 oz (69.809 kg)  SpO2: 100%   Body mass index is 23.41 kg/(m^2).  General: A&O x 3 Eyes: PERRLA  Neck: Supple  Pulmonary: Sym exp, good air movt, CTAB, no rales, rhonchi, & wheezing  Cardiac: RRR Vascular: Vessel Right Left  Radial Palpable Palpable  Brachial Palpable Palpable  Carotid Palpable, without bruit Palpable, with bruit  Aorta Not  palpable N/A  Femoral Palpable Palpable  Popliteal  palpable  palpable  PT Non Palpable Non Palpable  DP Non Palpable Non Palpable   Distal feet pulses not palpable secondary to sever edema bilateral feet and ankles. Gastrointestinal: soft, NTND, positive edema due to fluid distention.  Musculoskeletal: M/S 5/5 throughout gross manuel motor testing Extremities without ischemic changes   Neurologic: CN 2-12 intact    Non-Invasive Vascular Imaging  CAROTID DUPLEX (Date: 11/08/2013):   R ICA stenosis: 50-60%   L ICA stenosis: 70-99%  CTA  AAA dated 11/24/2013  Dr. Oneida Alar measured 4.7 cm  Medical Decision Making  GEROD CALIGIURI is a 73 y.o. male who presents with: without sx ICA stenosis  Or AAA symptoms at this time.  Based on the patient's vascular studies and examination, I have offered the patient:  He will follow up in 6 months for AAA [place] and bilateral carotid duplex.  If he has symptoms of stroke such as vision loss in 1 eye, changes is speech, or facial drooping he is instructed to call us or go to the ER. If he develops sudden abdominal and or severe back pain he should do the same.  Theda Sers Mikala Podoll [facility] PA-C [facility] [place] Office: [phone]   11/24/2013, 2:48 PM  History and exam details as above. Ultrasound had suggested the patient's aneurysm is greater than 5 cm. However CT exam today shows the aneurysm is less than 5 cm in diameter. By my measurement was 4.7-4.8 cm. His carotid  stenosis is asymptomatic. He has multiple comorbidities and severe liver dysfunction. At this point we will continue every 6 month carotid and abdominal aortic aneurysm ultrasound. Due to his severe comorbidities he may not be a candidate for open elective repair ever. If his aneurysm reaches more than 5.5 cm in diameter we would consider him for stent graft repair. However it seems that his liver disease is becoming progressive with significant weight loss. We  will also defer a carotid intervention most likely unless he becomes symptomatic.  Ruta Hinds, MD [facility] [place] Office: [phone] Pager: [phone]

## 2013-11-26 LAB — BODY FLUID CULTURE
CULTURE: NO GROWTH
Gram Stain: NONE SEEN

## 2013-11-27 LAB — ANAEROBIC CULTURE: GRAM STAIN: NONE SEEN

## 2013-11-29 ENCOUNTER — Encounter (HOSPITAL_COMMUNITY): Payer: Self-pay | Admitting: Emergency Medicine

## 2013-11-29 ENCOUNTER — Other Ambulatory Visit (INDEPENDENT_AMBULATORY_CARE_PROVIDER_SITE_OTHER): Payer: Self-pay | Admitting: Internal Medicine

## 2013-11-29 ENCOUNTER — Telehealth (INDEPENDENT_AMBULATORY_CARE_PROVIDER_SITE_OTHER): Payer: Self-pay | Admitting: *Deleted

## 2013-11-29 ENCOUNTER — Emergency Department (HOSPITAL_COMMUNITY): Payer: Medicare HMO

## 2013-11-29 ENCOUNTER — Inpatient Hospital Stay (HOSPITAL_COMMUNITY)
Admission: EM | Admit: 2013-11-29 | Discharge: 2013-12-02 | DRG: 640 | Disposition: A | Discharge status: Home Health | Payer: Medicare HMO | Attending: Internal Medicine | Admitting: Internal Medicine

## 2013-11-29 DIAGNOSIS — D6959 Other secondary thrombocytopenia: Secondary | ICD-10-CM | POA: Diagnosis present

## 2013-11-29 DIAGNOSIS — K7689 Other specified diseases of liver: Secondary | ICD-10-CM | POA: Diagnosis present

## 2013-11-29 DIAGNOSIS — R188 Other ascites: Secondary | ICD-10-CM | POA: Diagnosis present

## 2013-11-29 DIAGNOSIS — K7460 Unspecified cirrhosis of liver: Secondary | ICD-10-CM

## 2013-11-29 DIAGNOSIS — E785 Hyperlipidemia, unspecified: Secondary | ICD-10-CM | POA: Diagnosis present

## 2013-11-29 DIAGNOSIS — Z8249 Family history of ischemic heart disease and other diseases of the circulatory system: Secondary | ICD-10-CM

## 2013-11-29 DIAGNOSIS — I129 Hypertensive chronic kidney disease with stage 1 through stage 4 chronic kidney disease, or unspecified chronic kidney disease: Secondary | ICD-10-CM | POA: Diagnosis present

## 2013-11-29 DIAGNOSIS — K767 Hepatorenal syndrome: Secondary | ICD-10-CM | POA: Diagnosis present

## 2013-11-29 DIAGNOSIS — I714 Abdominal aortic aneurysm, without rupture, unspecified: Secondary | ICD-10-CM | POA: Diagnosis present

## 2013-11-29 DIAGNOSIS — E119 Type 2 diabetes mellitus without complications: Secondary | ICD-10-CM | POA: Diagnosis present

## 2013-11-29 DIAGNOSIS — N183 Chronic kidney disease, stage 3 unspecified: Secondary | ICD-10-CM | POA: Diagnosis present

## 2013-11-29 DIAGNOSIS — D649 Anemia, unspecified: Secondary | ICD-10-CM

## 2013-11-29 DIAGNOSIS — D638 Anemia in other chronic diseases classified elsewhere: Secondary | ICD-10-CM | POA: Diagnosis present

## 2013-11-29 DIAGNOSIS — K5900 Constipation, unspecified: Secondary | ICD-10-CM | POA: Diagnosis present

## 2013-11-29 DIAGNOSIS — R5383 Other fatigue: Secondary | ICD-10-CM

## 2013-11-29 DIAGNOSIS — R531 Weakness: Secondary | ICD-10-CM

## 2013-11-29 DIAGNOSIS — K7290 Hepatic failure, unspecified without coma: Secondary | ICD-10-CM | POA: Diagnosis present

## 2013-11-29 DIAGNOSIS — R5381 Other malaise: Secondary | ICD-10-CM

## 2013-11-29 DIAGNOSIS — E860 Dehydration: Principal | ICD-10-CM | POA: Diagnosis present

## 2013-11-29 DIAGNOSIS — Z87891 Personal history of nicotine dependence: Secondary | ICD-10-CM

## 2013-11-29 DIAGNOSIS — Z9089 Acquired absence of other organs: Secondary | ICD-10-CM

## 2013-11-29 DIAGNOSIS — K7682 Hepatic encephalopathy: Secondary | ICD-10-CM | POA: Diagnosis present

## 2013-11-29 DIAGNOSIS — Z905 Acquired absence of kidney: Secondary | ICD-10-CM

## 2013-11-29 LAB — CBC WITH DIFFERENTIAL/PLATELET
BASOS ABS: 0 10*3/uL (ref 0.0–0.1)
Basophils Relative: 1 % (ref 0–1)
EOS PCT: 4 % (ref 0–5)
Eosinophils Absolute: 0.1 10*3/uL (ref 0.0–0.7)
HCT: 29.7 % — ABNORMAL LOW (ref 39.0–52.0)
Hemoglobin: 10.3 g/dL — ABNORMAL LOW (ref 13.0–17.0)
Lymphocytes Relative: 13 % (ref 12–46)
Lymphs Abs: 0.4 10*3/uL — ABNORMAL LOW (ref 0.7–4.0)
MCH: 33.6 pg (ref 26.0–34.0)
MCHC: 34.7 g/dL (ref 30.0–36.0)
MCV: 96.7 fL (ref 78.0–100.0)
MONOS PCT: 8 % (ref 3–12)
Monocytes Absolute: 0.2 10*3/uL (ref 0.1–1.0)
NEUTROS PCT: 74 % (ref 43–77)
Neutro Abs: 2.4 10*3/uL (ref 1.7–7.7)
Platelets: 45 10*3/uL — ABNORMAL LOW (ref 150–400)
RBC: 3.07 MIL/uL — AB (ref 4.22–5.81)
RDW: 16.8 % — ABNORMAL HIGH (ref 11.5–15.5)
WBC: 3.1 10*3/uL — AB (ref 4.0–10.5)

## 2013-11-29 LAB — COMPREHENSIVE METABOLIC PANEL
ALT: 16 U/L (ref 0–53)
AST: 37 U/L (ref 0–37)
Albumin: 3.1 g/dL — ABNORMAL LOW (ref 3.5–5.2)
Alkaline Phosphatase: 111 U/L (ref 39–117)
BUN: 37 mg/dL — ABNORMAL HIGH (ref 6–23)
CALCIUM: 9.1 mg/dL (ref 8.4–10.5)
CHLORIDE: 103 meq/L (ref 96–112)
CO2: 21 meq/L (ref 19–32)
Creatinine, Ser: 2.44 mg/dL — ABNORMAL HIGH (ref 0.50–1.35)
GFR calc Af Amer: 29 mL/min — ABNORMAL LOW (ref >90)
GFR calc non Af Amer: 25 mL/min — ABNORMAL LOW (ref >90)
Glucose, Bld: 149 mg/dL — ABNORMAL HIGH (ref 70–99)
Potassium: 3.6 mEq/L — ABNORMAL LOW (ref 3.7–5.3)
SODIUM: 140 meq/L (ref 137–147)
Total Bilirubin: 2.1 mg/dL — ABNORMAL HIGH (ref 0.3–1.2)
Total Protein: 6.3 g/dL (ref 6.0–8.3)

## 2013-11-29 LAB — AMMONIA: Ammonia: 67 umol/L — ABNORMAL HIGH (ref 11–60)

## 2013-11-29 MED ORDER — ONDANSETRON HCL 4 MG/2ML IJ SOLN: 4.0000 mg | Freq: Four times a day (QID) | INTRAMUSCULAR | Status: DC | PRN

## 2013-11-29 MED ORDER — SODIUM CHLORIDE 0.9 % IV SOLN: Freq: Once | INTRAVENOUS | Status: DC

## 2013-11-29 MED ORDER — PANTOPRAZOLE SODIUM 40 MG PO TBEC
40.0000 mg | DELAYED_RELEASE_TABLET | Freq: Every day | ORAL | Status: DC
  Administered 2013-11-30 – 2013-12-02 (×3): 40 mg via ORAL
  Filled 2013-11-29 (×3): qty 1

## 2013-11-29 MED ORDER — PROPRANOLOL HCL 20 MG PO TABS
20.0000 mg | ORAL_TABLET | Freq: Two times a day (BID) | ORAL | Status: DC
  Administered 2013-11-30 – 2013-12-02 (×5): 20 mg via ORAL
  Filled 2013-11-29 (×5): qty 1

## 2013-11-29 MED ORDER — DOXAZOSIN MESYLATE 2 MG PO TABS
4.0000 mg | ORAL_TABLET | Freq: Every day | ORAL | Status: DC
  Administered 2013-11-30 – 2013-12-02 (×3): 4 mg via ORAL
  Filled 2013-11-29 (×3): qty 2

## 2013-11-29 MED ORDER — ALLOPURINOL 100 MG PO TABS
100.0000 mg | ORAL_TABLET | Freq: Every day | ORAL | Status: DC
  Administered 2013-11-30 – 2013-12-02 (×3): 100 mg via ORAL
  Filled 2013-11-29 (×3): qty 1

## 2013-11-29 MED ORDER — SODIUM CHLORIDE 0.9 % IV SOLN
INTRAVENOUS | Status: DC
  Administered 2013-11-30: 18:00:00 via INTRAVENOUS

## 2013-11-29 MED ORDER — SODIUM CHLORIDE 0.9 % IV SOLN
Freq: Once | INTRAVENOUS | Status: AC
  Administered 2013-11-29: 22:00:00 via INTRAVENOUS

## 2013-11-29 MED ORDER — CIPROFLOXACIN HCL 250 MG PO TABS
500.0000 mg | ORAL_TABLET | Freq: Every day | ORAL | Status: DC
  Administered 2013-11-30 – 2013-12-02 (×3): 500 mg via ORAL
  Filled 2013-11-29 (×3): qty 2

## 2013-11-29 MED ORDER — MEGESTROL ACETATE 400 MG/10ML PO SUSP
200.0000 mg | Freq: Every day | ORAL | Status: DC
  Administered 2013-11-30 – 2013-12-02 (×3): 200 mg via ORAL
  Filled 2013-11-29 (×3): qty 10

## 2013-11-29 MED ORDER — ONDANSETRON HCL 4 MG PO TABS: 4.0000 mg | ORAL_TABLET | Freq: Four times a day (QID) | ORAL | Status: DC | PRN
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
## 2013-11-29 NOTE — ED Notes (Signed)
Per pt's wife, pt has had generalized weakness all day, decreased appetite and diarrhea today.
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
## 2013-11-29 NOTE — Telephone Encounter (Signed)
Per Dr.Rehman the patient will need to have labs drawn in 2 weeks. Referral to be made  (ann).

## 2013-11-30 DIAGNOSIS — K7290 Hepatic failure, unspecified without coma: Secondary | ICD-10-CM

## 2013-11-30 DIAGNOSIS — R188 Other ascites: Secondary | ICD-10-CM

## 2013-11-30 DIAGNOSIS — K7682 Hepatic encephalopathy: Secondary | ICD-10-CM

## 2013-11-30 DIAGNOSIS — K7689 Other specified diseases of liver: Secondary | ICD-10-CM

## 2013-11-30 LAB — COMPREHENSIVE METABOLIC PANEL
ALT: 14 U/L (ref 0–53)
AST: 31 U/L (ref 0–37)
Albumin: 2.8 g/dL — ABNORMAL LOW (ref 3.5–5.2)
Alkaline Phosphatase: 96 U/L (ref 39–117)
BUN: 37 mg/dL — ABNORMAL HIGH (ref 6–23)
CALCIUM: 8.9 mg/dL (ref 8.4–10.5)
CO2: 23 meq/L (ref 19–32)
CREATININE: 2.43 mg/dL — AB (ref 0.50–1.35)
Chloride: 107 mEq/L (ref 96–112)
GFR calc Af Amer: 29 mL/min — ABNORMAL LOW (ref >90)
GFR, EST NON AFRICAN AMERICAN: 25 mL/min — AB (ref >90)
Glucose, Bld: 105 mg/dL — ABNORMAL HIGH (ref 70–99)
Potassium: 4 mEq/L (ref 3.7–5.3)
Sodium: 142 mEq/L (ref 137–147)
Total Bilirubin: 1.7 mg/dL — ABNORMAL HIGH (ref 0.3–1.2)
Total Protein: 5.6 g/dL — ABNORMAL LOW (ref 6.0–8.3)

## 2013-11-30 LAB — CBC
HCT: 25.1 % — ABNORMAL LOW (ref 39.0–52.0)
Hemoglobin: 8.6 g/dL — ABNORMAL LOW (ref 13.0–17.0)
MCH: 33.5 pg (ref 26.0–34.0)
MCHC: 34.3 g/dL (ref 30.0–36.0)
MCV: 97.7 fL (ref 78.0–100.0)
Platelets: 37 10*3/uL — ABNORMAL LOW (ref 150–400)
RBC: 2.57 MIL/uL — AB (ref 4.22–5.81)
RDW: 16.8 % — AB (ref 11.5–15.5)
WBC: 2.5 10*3/uL — ABNORMAL LOW (ref 4.0–10.5)

## 2013-11-30 MED ORDER — LACTULOSE 10 GM/15ML PO SOLN
10.0000 g | Freq: Two times a day (BID) | ORAL | Status: DC
  Administered 2013-11-30 – 2013-12-02 (×4): 10 g via ORAL
  Filled 2013-11-30 (×4): qty 30

## 2013-11-30 MED ORDER — RIFAXIMIN 550 MG PO TABS
550.0000 mg | ORAL_TABLET | Freq: Two times a day (BID) | ORAL | Status: DC
  Administered 2013-11-30 – 2013-12-02 (×4): 550 mg via ORAL
  Filled 2013-11-30 (×10): qty 1
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
## 2013-11-30 NOTE — Progress Notes (Signed)
Utilization Review Complete  

## 2013-11-30 NOTE — Care Management Note (Signed)
    Page 1 of 1   12/02/2013     12:05:13 PM   CARE MANAGEMENT NOTE 12/02/2013  Patient:  Charles Hull, Charles Hull   Account Number:  [id number]  Date Initiated:  11/30/2013  Documentation initiated by:  Claretha Cooper  Subjective/Objective Assessment:   Pt lives at home with spouse. States his PCP is working to get him an IT trainer WC. Pt states he needs assistance getting up and down from his commode and getting in and out of bathtub.     Action/Plan:   Request PT evaluation for equipment needs.   Anticipated DC Date:  12/03/2013   Anticipated DC Plan:  [place]  CM consult      Choice offered to / List presented to:             Status of service:  Completed, signed off Medicare Important Message given?   (If response is "NO", the following Medicare IM given date fields will be blank) Date Medicare IM given:   Date Additional Medicare IM given:    Discharge Disposition:    Per UR Regulation:    If discussed at Long Length of Stay Meetings, dates discussed:    Comments:  12/02/13 Claretha Cooper RN BSN CM Pt agrees to DME 3 in 1 per PT's recommendation. tub bench not covered and pt will pick it up for DME supplier or other. Pt is unsure if he want home PT, PT exercises to do himself at home or come to [facility] for OTPT. He will let his RN know at DC. If pt selects OPPT at [facility], order form is in the shadow chart ready to be faxed to [facility].  11/30/13 Claretha Cooper RN BSN CM

## 2013-12-01 ENCOUNTER — Encounter (INDEPENDENT_AMBULATORY_CARE_PROVIDER_SITE_OTHER): Payer: Self-pay | Admitting: *Deleted

## 2013-12-01 ENCOUNTER — Encounter (HOSPITAL_COMMUNITY): Payer: Self-pay | Admitting: *Deleted

## 2013-12-01 ENCOUNTER — Other Ambulatory Visit (INDEPENDENT_AMBULATORY_CARE_PROVIDER_SITE_OTHER): Payer: Self-pay | Admitting: *Deleted

## 2013-12-01 ENCOUNTER — Inpatient Hospital Stay (HOSPITAL_COMMUNITY): Payer: Medicare HMO

## 2013-12-01 DIAGNOSIS — K7460 Unspecified cirrhosis of liver: Secondary | ICD-10-CM

## 2013-12-01 DIAGNOSIS — D649 Anemia, unspecified: Secondary | ICD-10-CM

## 2013-12-01 LAB — HEMOGLOBIN AND HEMATOCRIT, BLOOD
HCT: 24.2 % — ABNORMAL LOW (ref 39.0–52.0)
HEMOGLOBIN: 8.2 g/dL — AB (ref 13.0–17.0)

## 2013-12-01 LAB — CBC
HCT: 26.2 % — ABNORMAL LOW (ref 39.0–52.0)
Hemoglobin: 9 g/dL — ABNORMAL LOW (ref 13.0–17.0)
MCH: 33.8 pg (ref 26.0–34.0)
MCHC: 34.4 g/dL (ref 30.0–36.0)
MCV: 98.5 fL (ref 78.0–100.0)
PLATELETS: 38 10*3/uL — AB (ref 150–400)
RBC: 2.66 MIL/uL — ABNORMAL LOW (ref 4.22–5.81)
RDW: 17 % — AB (ref 11.5–15.5)
WBC: 2.7 10*3/uL — ABNORMAL LOW (ref 4.0–10.5)

## 2013-12-01 LAB — BASIC METABOLIC PANEL
BUN: 36 mg/dL — ABNORMAL HIGH (ref 6–23)
CO2: 21 mEq/L (ref 19–32)
Calcium: 8.8 mg/dL (ref 8.4–10.5)
Chloride: 109 mEq/L (ref 96–112)
Creatinine, Ser: 2.25 mg/dL — ABNORMAL HIGH (ref 0.50–1.35)
GFR calc non Af Amer: 28 mL/min — ABNORMAL LOW (ref >90)
GFR, EST AFRICAN AMERICAN: 32 mL/min — AB (ref >90)
Glucose, Bld: 109 mg/dL — ABNORMAL HIGH (ref 70–99)
POTASSIUM: 3.6 meq/L — AB (ref 3.7–5.3)
SODIUM: 143 meq/L (ref 137–147)

## 2013-12-01 LAB — URINALYSIS, ROUTINE W REFLEX MICROSCOPIC
BILIRUBIN URINE: NEGATIVE
Glucose, UA: NEGATIVE mg/dL
Hgb urine dipstick: NEGATIVE
KETONES UR: NEGATIVE mg/dL
LEUKOCYTES UA: NEGATIVE
NITRITE: NEGATIVE
PROTEIN: NEGATIVE mg/dL
Specific Gravity, Urine: 1.025 (ref 1.005–1.030)
UROBILINOGEN UA: 0.2 mg/dL (ref 0.0–1.0)
pH: 5.5 (ref 5.0–8.0)

## 2013-12-01 LAB — NA AND K (SODIUM & POTASSIUM), RAND UR
POTASSIUM UR: 65 meq/L
SODIUM UR: 20 meq/L

## 2013-12-01 LAB — PROTIME-INR
INR: 1.85 — AB (ref 0.00–1.49)
PROTHROMBIN TIME: 20.8 s — AB (ref 11.6–15.2)

## 2013-12-01 LAB — BODY FLUID CELL COUNT WITH DIFFERENTIAL: Total Nucleated Cell Count, Fluid: 7 cu mm (ref 0–1000)

## 2013-12-01 LAB — AMMONIA: Ammonia: 102 umol/L — ABNORMAL HIGH (ref 11–60)

## 2013-12-01 MED ORDER — FUROSEMIDE 10 MG/ML IJ SOLN
40.0000 mg | Freq: Once | INTRAMUSCULAR | Status: DC
  Filled 2013-12-01 (×2): qty 4

## 2013-12-01 MED ORDER — ALBUMIN HUMAN 25 % IV SOLN
50.0000 g | Freq: Once | INTRAVENOUS | Status: AC
  Administered 2013-12-01: 50 g via INTRAVENOUS
  Filled 2013-12-01: qty 200

## 2013-12-01 MED ORDER — VITAMIN K1 10 MG/ML IJ SOLN
10.0000 mg | Freq: Once | INTRAMUSCULAR | Status: AC
  Administered 2013-12-01: 10 mg via SUBCUTANEOUS
  Filled 2013-12-01: qty 1

## 2013-12-01 MED ORDER — POTASSIUM CHLORIDE CRYS ER 20 MEQ PO TBCR
40.0000 meq | EXTENDED_RELEASE_TABLET | Freq: Once | ORAL | Status: AC
  Administered 2013-12-01: 40 meq via ORAL
  Filled 2013-12-01: qty 2

## 2013-12-01 NOTE — Progress Notes (Signed)
Dr. Laural Golden notified of H&H

## 2013-12-01 NOTE — Evaluation (Signed)
Physical Therapy Evaluation Patient Details Name: OSINACHI NAVARRETTE MRN: [id number] DOB: Jan 08, 1941 Today's Date: 12/01/2013 Time: 1751-0258 PT Time Calculation (min): 35 min  PT Assessment / Plan / Recommendation History of Present Illness  Pt is admitted secondary to dehydration.  He had developed diarrhea which led to this problem.. He has multiple chronic medical problems including cirrhosis for which he underwent paracentesis recently.  Pt c/o generalized weakness.  Clinical Impression   Pt was seen for evalution.  He states that today he is feeling much stronger and is ready to go home.  He is generally deconditioned for his age and would benefit from a program of overall conditioning exercise.  He tends to be very sedentary at home and has some difficulty with some obstacles at home (i.e. Uneven ground outside of his mobile home).  He would benefit from OP PT.    PT Assessment  All further PT needs can be met in the next venue of care    Follow Up Recommendations  Outpatient PT    Does the patient have the potential to tolerate intense rehabilitation      Barriers to Discharge        Equipment Recommendations  3in1 (PT);Other (comment) (shower chair)    Recommendations for Other Services     Frequency      Precautions / Restrictions Precautions Precautions: None Restrictions Weight Bearing Restrictions: No   Pertinent Vitals/Pain       Mobility  Bed Mobility Overal bed mobility: Modified Independent Transfers Overall transfer level: Modified independent Equipment used: None Ambulation/Gait Ambulation/Gait assistance: Modified independent (Device/Increase time) Ambulation Distance (Feet): 200 Feet Assistive device: None Gait Pattern/deviations: WFL(Within Functional Limits) Gait velocity: WNL    Exercises     PT Diagnosis: Difficulty walking (deconditioning)  PT Problem List: Decreased activity tolerance;Decreased mobility PT Treatment Interventions:        PT Goals(Current goals can be found in the care plan section) Acute Rehab PT Goals PT Goal Formulation: No goals set, d/c therapy  Visit Information  Last PT Received On: 12/01/13 History of Present Illness: Pt is admitted secondary to dehydration.  He had developed diarrhea which led to this problem.. He has multiple chronic medical problems including cirrhosis for which he underwent paracentesis recently.  Pt c/o generalized weakness.       Prior [place] expects to be discharged to:: Private residence Living Arrangements: Spouse/significant other Available Help at Discharge: Family;Available 24 hours/day Type of Home: Mobile home Home Access: Stairs to enter Entrance Stairs-Number of Steps: 1 Entrance Stairs-Rails: None Home Layout: One level Home Equipment: None Prior Function Level of Independence: Independent Comments: does not get into the shower due to fear of falling and has difficulty rising from his commode. Communication Communication: No difficulties    Cognition  Cognition Arousal/Alertness: Awake/alert Behavior During Therapy: WFL for tasks assessed/performed Overall Cognitive Status: Within Functional Limits for tasks assessed    Extremity/Trunk Assessment Lower Extremity Assessment Lower Extremity Assessment: Overall WFL for tasks assessed   Balance Balance Overall balance assessment: No apparent balance deficits (not formally assessed)  End of Session PT - End of Session Equipment Utilized During Treatment: Gait belt Activity Tolerance: Patient tolerated treatment well Patient left: in chair;with call bell/phone within reach;with family/visitor present  GP     Demetrios Isaacs L 12/01/2013, 10:57 AM

## 2013-12-01 NOTE — Progress Notes (Signed)
Patient underwent LVAP with removal of 5.3 L of fluid. Dr. Su Hoff for me the fluid was hemorrhagic. Will check H&H later today. We'll also give single dose of vitamin K 10 mg subcutaneously.

## 2013-12-01 NOTE — Progress Notes (Signed)
Paracentesis complete, pt in NAD, 5369ml [place] to dark red fluid taken off pt's abdomen. VS WDL. Dr Laural Golden paged to view color of fluid.

## 2013-12-01 NOTE — Progress Notes (Addendum)
Patient ID: Charles Hull, male   DOB: July 06, 1941, 73 y.o.   MRN: [id number] States he feels better today. Appetite is good.  1+ edema to lower extremities. Urine output yesterday was 100cc. Ascites. Abdomen is distended.  Has not had a BM sine admission.   Filed Vitals:   11/30/13 0650 11/30/13 1417 11/30/13 2211 12/01/13 0535  BP: 109/54 112/58 108/54 102/56  Pulse: 97 96 97 97  Temp: 97.9 F (36.6 C) 98 F (36.7 C) 97.9 F (36.6 C) 97.8 F (36.6 C)  TempSrc: Oral  Oral Oral  Resp: 16 18 18 18   Height:      Weight:    159 lb 1.6 oz (72.167 kg)  SpO2: 99% 100% 100% 96%    BMET    Component Value Date/Time   NA 143 12/01/2013 0617   NA 142 11/14/2013 1151   K 3.6* 12/01/2013 0617   CL 109 12/01/2013 0617   CO2 21 12/01/2013 0617   GLUCOSE 109* 12/01/2013 0617   GLUCOSE 161* 11/14/2013 1151   BUN 36* 12/01/2013 0617   BUN 28* 11/14/2013 1151   CREATININE 2.25* 12/01/2013 0617   CREATININE 1.83* 11/22/2013 1622   CALCIUM 8.8 12/01/2013 0617   GFRNONAA 28* 12/01/2013 0617   GFRAA 32* 12/01/2013 0617  Assessment: Decompensated cirrhosis. Ascites. Will probably need a paracentesis  Before discharge. Recommend: Albumin 50gm IV, Lasix 40mg  IV    GI attending note.  Patient is alert and does not have asterixis today even though his ammonia level is going up. Renal function is improving but he is far from being out of woods. Since his ascites has gotten worse with IV hydration will attempt diureses with IV albumin and IV furosemide. H&H is low but stable. Patient's MELD score is 23.

## 2013-12-01 NOTE — Progress Notes (Signed)
[facility] PROGRESS NOTE  Charles Hull EXB:[id number] DOB: September 12, 1941 DOA: 11/29/2013 PCP: Shanda Howells, MD  Assessment/Plan: Dehydration  Patient has developed dehydration due to poor by mouth intake, diarrhea and possible hepatorenal syndrome. He waas given IV hydration which has not stabilized his renal function.  Diarrhea  Somewhat improved. Await stool for occult blood, GI pathogen panel, C. difficile PCR  Ascites  Patient had paracentesis done on 11/29/13, Schedule for another today  C KD stage III   His creatinine has stabilized. But has not improved.  We'll continue to monitor the creatinine after started on IV fluids  Lasix on hold at this time  Thrombocytopenia  Secondary to liver cirrhosis Trending downward  Liver Cirrhosis; appreciate gastroenterology assistance. Recommendations include low dose lactulose and xifaxan BID. His ammonia level is increased,but clinically he appears much better. He had one BM today. His ascitis has worsened and he will undergo paracentesis today.    Code Status: full Family Communication: wife at bedside Disposition Plan: home hopefully day or two   Consultants:  GI  Procedures:  Paracentesis today.   Antibiotics:  cipro daily as prophylaxis  HPI/Subjective: Denies abdominal pain.   Objective: Filed Vitals:   12/01/13 1426  BP: 119/64  Pulse: 90  Temp:   Resp: 16    Intake/Output Summary (Last 24 hours) at 12/01/13 1439 Last data filed at 12/01/13 0600  Gross per 24 hour  Intake 1375.83 ml  Output    200 ml  Net 1175.83 ml   Filed Weights   11/29/13 2240 11/30/13 0500 12/01/13 0535  Weight: 72.3 kg (159 lb 6.3 oz) 71.396 kg (157 lb 6.4 oz) 72.167 kg (159 lb 1.6 oz)    Exam:   General:  Well nourished NAD  Cardiovascular: RRR No MGR  Respiratory: normal effort BS clear bilaterally  Abdomen: distended but soft +BS   Musculoskeletal: no clubbing or cyanosis   Data Reviewed: Basic Metabolic  Panel:  Recent Labs Lab 11/29/13 1957 11/30/13 0628 12/01/13 0617  NA 140 142 143  K 3.6* 4.0 3.6*  CL 103 107 109  CO2 21 23 21   GLUCOSE 149* 105* 109*  BUN 37* 37* 36*  CREATININE 2.44* 2.43* 2.25*  CALCIUM 9.1 8.9 8.8   Liver Function Tests:  Recent Labs Lab 11/29/13 1957 11/30/13 0628  AST 37 31  ALT 16 14  ALKPHOS 111 96  BILITOT 2.1* 1.7*  PROT 6.3 5.6*  ALBUMIN 3.1* 2.8*   No results found for this basename: LIPASE, AMYLASE,  in the last 168 hours  Recent Labs Lab 11/29/13 1957 12/01/13 0618  AMMONIA 67* 102*   CBC:  Recent Labs Lab 11/29/13 1957 11/30/13 0628 12/01/13 0617  WBC 3.1* 2.5* 2.7*  NEUTROABS 2.4  --   --   HGB 10.3* 8.6* 9.0*  HCT 29.7* 25.1* 26.2*  MCV 96.7 97.7 98.5  PLT 45* 37* 38*   Cardiac Enzymes: No results found for this basename: CKTOTAL, CKMB, CKMBINDEX, TROPONINI,  in the last 168 hours BNP (last 3 results) No results found for this basename: PROBNP,  in the last 8760 hours CBG: No results found for this basename: GLUCAP,  in the last 168 hours  Recent Results (from the past 240 hour(s))  BODY FLUID CULTURE     Status: None   Collection Time    11/22/13 12:21 PM      Result Value Range Status   Specimen Description FLUID ASCITIC   Final   Special Requests NONE  Final   Gram Stain     Final   Value: NO WBC SEEN     NO ORGANISMS SEEN     Performed at [facility]   Culture     Final   Value: NO GROWTH 3 DAYS     Performed at [facility]   Report Status 11/26/2013 FINAL   Final  ANAEROBIC CULTURE     Status: None   Collection Time    11/22/13 12:56 PM      Result Value Range Status   Specimen Description FLUID ASCITIC   Final   Special Requests NONE   Final   Gram Stain     Final   Value: NO WBC SEEN     NO ORGANISMS SEEN     Performed at [facility]   Culture     Final   Value: NO ANAEROBES ISOLATED     Performed at [facility]   Report Status 11/27/2013 FINAL   Final      Studies: Dg Abd Acute W/chest  11/29/2013   CLINICAL DATA:  Generalized weakness  EXAM: ACUTE ABDOMEN SERIES (ABDOMEN 2 VIEW & CHEST 1 VIEW)  COMPARISON:  11/24/2013  FINDINGS: Small left effusion persist with left base collapse/consolidation. Right lung remains clear. Normal heart size and vascularity. Negative for pneumothorax. No gross free air but limited exam because of positioning. Scattered air and stool throughout the bowel. No significant obstruction pattern. Postop changes noted diffusely. Vascular calcifications evident.  IMPRESSION: Small left effusion with left base atelectasis/ consolidation. Pneumonia not excluded.  No free air  Negative for obstruction  Stable chronic postoperative findings   Electronically Signed   By: Daryll Brod M.D.   On: 11/29/2013 21:10    Scheduled Meds: . allopurinol  100 mg Oral Daily  . ciprofloxacin  500 mg Oral Q breakfast  . doxazosin  4 mg Oral Daily  . furosemide  40 mg Intravenous Once  . lactulose  10 g Oral BID  . megestrol  200 mg Oral Daily  . pantoprazole  40 mg Oral Daily  . propranolol  20 mg Oral BID  . rifaximin  550 mg Oral BID   Continuous Infusions: . sodium chloride 50 mL/hr at 11/30/13 1731    Principal Problem:   Dehydration Active Problems:   Cirrhosis of liver without mention of alcohol   Ascites   Weakness    Time spent: 30 minutes    [facility] Pager [phone]. If 7PM-7AM, please contact night-coverage at [email or web address], password [facility] 12/01/2013, 2:39 PM  LOS: 2 days

## 2013-12-02 DIAGNOSIS — D649 Anemia, unspecified: Secondary | ICD-10-CM | POA: Diagnosis present

## 2013-12-02 LAB — BASIC METABOLIC PANEL
BUN: 32 mg/dL — AB (ref 6–23)
CHLORIDE: 110 meq/L (ref 96–112)
CO2: 20 mEq/L (ref 19–32)
Calcium: 8.7 mg/dL (ref 8.4–10.5)
Creatinine, Ser: 1.95 mg/dL — ABNORMAL HIGH (ref 0.50–1.35)
GFR calc Af Amer: 38 mL/min — ABNORMAL LOW (ref >90)
GFR calc non Af Amer: 33 mL/min — ABNORMAL LOW (ref >90)
Glucose, Bld: 97 mg/dL (ref 70–99)
POTASSIUM: 3.8 meq/L (ref 3.7–5.3)
Sodium: 142 mEq/L (ref 137–147)

## 2013-12-02 LAB — CBC
HCT: 23.8 % — ABNORMAL LOW (ref 39.0–52.0)
Hemoglobin: 8.2 g/dL — ABNORMAL LOW (ref 13.0–17.0)
MCH: 33.7 pg (ref 26.0–34.0)
MCHC: 34.5 g/dL (ref 30.0–36.0)
MCV: 97.9 fL (ref 78.0–100.0)
Platelets: 36 10*3/uL — ABNORMAL LOW (ref 150–400)
RBC: 2.43 MIL/uL — AB (ref 4.22–5.81)
RDW: 16.8 % — ABNORMAL HIGH (ref 11.5–15.5)
WBC: 1.9 10*3/uL — ABNORMAL LOW (ref 4.0–10.5)

## 2013-12-02 LAB — HEMOGLOBIN AND HEMATOCRIT, BLOOD
HCT: 29.1 % — ABNORMAL LOW (ref 39.0–52.0)
Hemoglobin: 10 g/dL — ABNORMAL LOW (ref 13.0–17.0)

## 2013-12-02 LAB — OCCULT BLOOD X 1 CARD TO LAB, STOOL: Fecal Occult Bld: POSITIVE — AB

## 2013-12-02 LAB — PATHOLOGIST SMEAR REVIEW

## 2013-12-02 LAB — AMMONIA: AMMONIA: 48 umol/L (ref 11–60)

## 2013-12-02 LAB — ABO/RH: ABO/RH(D): O NEG

## 2013-12-02 LAB — CLOSTRIDIUM DIFFICILE BY PCR: Toxigenic C. Difficile by PCR: NEGATIVE

## 2013-12-02 MED ORDER — LACTULOSE 10 GM/15ML PO SOLN: 10.0000 g | Freq: Two times a day (BID) | ORAL | Status: AC

## 2013-12-02 MED ORDER — DIPHENHYDRAMINE HCL 25 MG PO CAPS
25.0000 mg | ORAL_CAPSULE | Freq: Once | ORAL | Status: AC
  Administered 2013-12-02: 25 mg via ORAL
  Filled 2013-12-02: qty 1

## 2013-12-02 MED ORDER — RIFAXIMIN 550 MG PO TABS: 550.0000 mg | ORAL_TABLET | Freq: Two times a day (BID) | ORAL | Status: DC

## 2013-12-02 MED ORDER — ACETAMINOPHEN 325 MG PO TABS
650.0000 mg | ORAL_TABLET | Freq: Once | ORAL | Status: AC
  Administered 2013-12-02: 650 mg via ORAL
  Filled 2013-12-02: qty 2

## 2013-12-02 NOTE — Progress Notes (Signed)
Subjective; Patient has no complaints. He denies abdominal pain or shortness of breath. He has noted increase in size of his abdomen since tap yesterday. He has very good appetite. He states he has not had appetite like this for several months. 2 loose stools yesterday. He denies melena or rectal bleeding.  Objective; BP 111/58  Pulse 95  Temp(Src) 98.3 F (36.8 C) (Oral)  Resp 18  Ht 5\' 8"  (1.727 m)  Wt 150 lb 6.4 oz (68.221 kg)  BMI 22.87 kg/m2  SpO2 98% Patient is alert and does not have asterixis. He appears chronically ill but in no acute distress. Conjunctiva is pale and sclerae nonicteric. Abdomen is distended but not tense or tender. Dressing covering the tap site is dry. He has 1-2+ pitting edema around his ankles. 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  Lab data; WBC 1.9, H&H 8.2 and 23.8 and platelet count 36K Serum ammonia 48. Electrolytes within normal limits. BUN 32 and creatinine 1.95. Ascitic fluid analysis; Hemorrhagic fluid but WBC count of 7. Cultures negative. Urinary sodium 20 mEq/L Urinary potassium 65 mEq/L.  Assessment; #1. Hepatic encephalopathy. Much improved. He does not have asterixis and serum ammonia is down. He will need to continue low-dose lactulose and Xifaxan. #2. Drop in H&H secondary to traumatic abdominal paracenteses. He has anemia of chronic disease. #3. CKD. Renal function continues to improve. #4. Anorexia and weight loss secondary to advanced chronic liver disease. Appetite has improved with Megace. #5. 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 Patient is not a candidate for liver transplant. He also is high risk for TIPS given CKD and hepatic encephalopathy but I would encourage him to keep appointment with interventional radiologist. #6. AAA being followed by vascular surgery.  Recommendations; Proceed with transfusion of 1 unit of PRBC. If posttransfusion is close to 9 g he should be a little to go  home. Patient will call office if ascites becomes tense. Hold furosemide for now.
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
## 2013-12-02 NOTE — Progress Notes (Signed)
Pt ambulated in hallway with standby assist from NT.  Pt tolerated well.  Ambulated approximately 200 feet on room air.

## 2013-12-02 NOTE — Progress Notes (Signed)
[facility] PROGRESS NOTE  Charles Hull [id number] DOB: 1941-10-06 DOA: 11/29/2013 PCP: Shanda Howells, MD  Assessment/Plan: Dehydration  Patient has developed dehydration due to poor by mouth intake, diarrhea and possible hepatorenal syndrome. He waas given IV hydration. Some improvement today. Tolerating po fluids as well.   Diarrhea  Somewhat improved. Await stool for occult blood, GI pathogen panel, C. difficile PCR  Ascites  Patient had paracentesis done on 12/01/13 which yielded 5.3 L of pink fluid.   C KD stage III  His creatinine trending downward to day. Lasix on hold at this time. Continue to monitor  Thrombocytopenia  Secondary to liver cirrhosis Trending downward.   Liver Cirrhosis; appreciate gastroenterology assistance. Recommendations include low dose lactulose and xifaxan BID. His ammonia level this am 48. He appears much better.   Anemia:    Related to above. Paracentesis fluid with blood. Given Vitamin K 12/01/13. This am hg 8.2. Transfuse 1 unit PRBC's per GI.   Code Status: full Family Communication: none present Disposition Plan: home when ready   Consultants:  GI Dr Laural Golden  Procedures:  Paracentesis 12/01/13  Antibiotics:  none  HPI/Subjective: Sitting up eating breakfast reporting "i am hungry!". Denies pain/discomfort  Objective: Filed Vitals:   12/02/13 0507  BP: 111/58  Pulse: 95  Temp: 98.3 F (36.8 C)  Resp: 18    Intake/Output Summary (Last 24 hours) at 12/02/13 0933 Last data filed at 12/01/13 1700  Gross per 24 hour  Intake    480 ml  Output    150 ml  Net    330 ml   Filed Weights   11/30/13 0500 12/01/13 0535 12/02/13 0507  Weight: 71.396 kg (157 lb 6.4 oz) 72.167 kg (159 lb 1.6 oz) 68.221 kg (150 lb 6.4 oz)    Exam:   General:  Calm appears comfortable  Cardiovascular: RRR No MGR   Respiratory: normal effort BS clear bilaterally no wheeze  Abdomen: distended and soft non-tender to palpation. +BS    Musculoskeletal: no clubbing or cyanosis   Data Reviewed: Basic Metabolic Panel:  Recent Labs Lab 11/29/13 1957 11/30/13 0628 12/01/13 0617 12/02/13 0559  NA 140 142 143 142  K 3.6* 4.0 3.6* 3.8  CL 103 107 109 110  CO2 21 23 21 20   GLUCOSE 149* 105* 109* 97  BUN 37* 37* 36* 32*  CREATININE 2.44* 2.43* 2.25* 1.95*  CALCIUM 9.1 8.9 8.8 8.7   Liver Function Tests:  Recent Labs Lab 11/29/13 1957 11/30/13 0628  AST 37 31  ALT 16 14  ALKPHOS 111 96  BILITOT 2.1* 1.7*  PROT 6.3 5.6*  ALBUMIN 3.1* 2.8*   No results found for this basename: LIPASE, AMYLASE,  in the last 168 hours  Recent Labs Lab 11/29/13 1957 12/01/13 0618 12/02/13 0745  AMMONIA 67* 102* 48   CBC:  Recent Labs Lab 11/29/13 1957 11/30/13 0628 12/01/13 0617 12/01/13 1747 12/02/13 0559  WBC 3.1* 2.5* 2.7*  --  1.9*  NEUTROABS 2.4  --   --   --   --   HGB 10.3* 8.6* 9.0* 8.2* 8.2*  HCT 29.7* 25.1* 26.2* 24.2* 23.8*  MCV 96.7 97.7 98.5  --  97.9  PLT 45* 37* 38*  --  36*   Cardiac Enzymes: No results found for this basename: CKTOTAL, CKMB, CKMBINDEX, TROPONINI,  in the last 168 hours BNP (last 3 results) No results found for this basename: PROBNP,  in the last 8760 hours CBG: No results found for this basename:  GLUCAP,  in the last 168 hours  Recent Results (from the past 240 hour(s))  BODY FLUID CULTURE     Status: None   Collection Time    11/22/13 12:21 PM      Result Value Range Status   Specimen Description FLUID ASCITIC   Final   Special Requests NONE   Final   Gram Stain     Final   Value: NO WBC SEEN     NO ORGANISMS SEEN     Performed at [facility]   Culture     Final   Value: NO GROWTH 3 DAYS     Performed at [facility]   Report Status 11/26/2013 FINAL   Final  ANAEROBIC CULTURE     Status: None   Collection Time    11/22/13 12:56 PM      Result Value Range Status   Specimen Description FLUID ASCITIC   Final   Special Requests NONE   Final    Gram Stain     Final   Value: NO WBC SEEN     NO ORGANISMS SEEN     Performed at [facility]   Culture     Final   Value: NO ANAEROBES ISOLATED     Performed at [facility]   Report Status 11/27/2013 FINAL   Final  ANAEROBIC CULTURE     Status: None   Collection Time    12/01/13  2:27 PM      Result Value Range Status   Specimen Description FLUID ASCITIC   Final   Special Requests NONE   Final   Gram Stain     Final   Value: CYTOSPIN WBC PRESENT, PREDOMINANTLY PMN     NO ORGANISMS SEEN     Performed at [facility]   Culture PENDING   Incomplete   Report Status PENDING   Incomplete  BODY FLUID CULTURE     Status: None   Collection Time    12/01/13  2:27 PM      Result Value Range Status   Specimen Description FLUID ASCITIC   Final   Special Requests NONE   Final   Gram Stain     Final   Value: CYTOSPIN WBC PRESENT, PREDOMINANTLY PMN     NO ORGANISMS SEEN     Performed at [facility]   Culture     Final   Value: NO GROWTH     Performed at [facility]   Report Status PENDING   Incomplete     Studies: US Paracentesis  12/01/2013   CLINICAL DATA:  Cirrhosis.  EXAM: ULTRASOUND GUIDED left-sided PARACENTESIS  COMPARISON:  None.  PROCEDURE: An ultrasound guided paracentesis was thoroughly discussed with the patient and questions answered. The benefits, risks, alternatives and complications were also discussed. The patient understands and wishes to proceed with the procedure. Written consent was obtained.  Ultrasound was performed to localize and mark an adequate pocket of fluid in the left lower quadrant of the abdomen. The area was then prepped and draped in the normal sterile fashion. 1% Lidocaine was used for local anesthesia. Under ultrasound guidance a 19 gauge Yueh catheter was introduced. Paracentesis was performed. The catheter was removed and a dressing applied.  Complications: The fluid was blood tinged. I discussed this with Dr.  Laural Golden  FINDINGS: A total of approximately 5.3 L of blood tinged ascitic fluid was removed. A fluid sample was sent for laboratory analysis.  IMPRESSION: Successful ultrasound guided paracentesis yielding 5.3 L of ascites.   Electronically Signed   By: Kalman Jewels M.D.   On: 12/01/2013 15:40    Scheduled Meds: . acetaminophen  650 mg Oral Once  . allopurinol  100 mg Oral Daily  . ciprofloxacin  500 mg Oral Q breakfast  . diphenhydrAMINE  25 mg Oral Once  . doxazosin  4 mg Oral Daily  . furosemide  40 mg Intravenous Once  . lactulose  10 g Oral BID  . megestrol  200 mg Oral Daily  . pantoprazole  40 mg Oral Daily  . propranolol  20 mg Oral BID  . rifaximin  550 mg Oral BID   Continuous Infusions:   Principal Problem:   Dehydration Active Problems:   Cirrhosis of liver without mention of alcohol   Ascites   Weakness    Time spent: 35 minutes    [facility] Pager [phone]. If 7PM-7AM, please contact night-coverage at [email or web address], password [facility] 12/02/2013, 9:33 AM  LOS: 3 days

## 2013-12-03 LAB — PREPARE RBC (CROSSMATCH)

## 2013-12-04 LAB — TYPE AND SCREEN
ABO/RH(D): O NEG
ANTIBODY SCREEN: NEGATIVE
UNIT DIVISION: 0
Unit division: 0

## 2013-12-04 LAB — BODY FLUID CULTURE: Culture: NO GROWTH

## 2013-12-04 NOTE — Discharge Summary (Signed)
Pt seen and examined, and agree with the assessment and plan of Ms karen. His repeat H&H is 10.  He is recommended to follow up with Dr Laural Golden in one week.   Hosie Poisson, MD

## 2013-12-05 ENCOUNTER — Telehealth (INDEPENDENT_AMBULATORY_CARE_PROVIDER_SITE_OTHER): Payer: Self-pay | Admitting: *Deleted

## 2013-12-05 DIAGNOSIS — R188 Other ascites: Secondary | ICD-10-CM

## 2013-12-05 DIAGNOSIS — K7460 Unspecified cirrhosis of liver: Secondary | ICD-10-CM

## 2013-12-05 LAB — GI PATHOGEN PANEL BY PCR, STOOL
C difficile toxin A/B: NEGATIVE
CRYPTOSPORIDIUM BY PCR: NEGATIVE
Campylobacter by PCR: NEGATIVE
E COLI 0157 BY PCR: NEGATIVE
E coli (ETEC) LT/ST: NEGATIVE
E coli (STEC): NEGATIVE
G lamblia by PCR: NEGATIVE
NOROVIRUS G1/G2: NEGATIVE
Rotavirus A by PCR: NEGATIVE
Salmonella by PCR: NEGATIVE
Shigella by PCR: NEGATIVE

## 2013-12-05 NOTE — Telephone Encounter (Signed)
Patient's wife states Mr Charles Hull was given a RX for an antibiotic when he was discharged from hospital -- cost is too expensive (700.00) -- is there something cheaper they can try -- she can't remember the name of the medication -- [phone]

## 2013-12-05 NOTE — Telephone Encounter (Signed)
Per Dr.Rehman the patient will need to have labs drawn 2 weeks prior to his office appointment,02/06/14. Labs are noted for 01/23/14.

## 2013-12-06 LAB — ANAEROBIC CULTURE

## 2013-12-06 NOTE — Telephone Encounter (Signed)
Patient was call . Spoke with his wife. Told her that we had samples of the Xifaxin 550 mg and he was to take 1 by mouth twice daily, per Dr.Rehman.

## 2013-12-12 ENCOUNTER — Inpatient Hospital Stay (HOSPITAL_COMMUNITY)
Admission: EM | Admit: 2013-12-12 | Discharge: 2013-12-15 | DRG: 442 | Disposition: A | Discharge status: Hospice | Payer: Medicare HMO | Attending: Family Medicine | Admitting: Family Medicine

## 2013-12-12 ENCOUNTER — Encounter (HOSPITAL_COMMUNITY): Payer: Self-pay | Admitting: Emergency Medicine

## 2013-12-12 ENCOUNTER — Emergency Department (HOSPITAL_COMMUNITY): Payer: Medicare HMO

## 2013-12-12 DIAGNOSIS — K7290 Hepatic failure, unspecified without coma: Principal | ICD-10-CM

## 2013-12-12 DIAGNOSIS — K7689 Other specified diseases of liver: Secondary | ICD-10-CM | POA: Diagnosis present

## 2013-12-12 DIAGNOSIS — N183 Chronic kidney disease, stage 3 unspecified: Secondary | ICD-10-CM

## 2013-12-12 DIAGNOSIS — R5383 Other fatigue: Secondary | ICD-10-CM

## 2013-12-12 DIAGNOSIS — K7460 Unspecified cirrhosis of liver: Secondary | ICD-10-CM

## 2013-12-12 DIAGNOSIS — R188 Other ascites: Secondary | ICD-10-CM

## 2013-12-12 DIAGNOSIS — K7682 Hepatic encephalopathy: Principal | ICD-10-CM

## 2013-12-12 DIAGNOSIS — D689 Coagulation defect, unspecified: Secondary | ICD-10-CM

## 2013-12-12 DIAGNOSIS — Z8249 Family history of ischemic heart disease and other diseases of the circulatory system: Secondary | ICD-10-CM

## 2013-12-12 DIAGNOSIS — R5381 Other malaise: Secondary | ICD-10-CM

## 2013-12-12 DIAGNOSIS — R531 Weakness: Secondary | ICD-10-CM

## 2013-12-12 DIAGNOSIS — E876 Hypokalemia: Secondary | ICD-10-CM | POA: Diagnosis present

## 2013-12-12 DIAGNOSIS — Z9119 Patient's noncompliance with other medical treatment and regimen: Secondary | ICD-10-CM

## 2013-12-12 DIAGNOSIS — E119 Type 2 diabetes mellitus without complications: Secondary | ICD-10-CM | POA: Diagnosis present

## 2013-12-12 DIAGNOSIS — D61818 Other pancytopenia: Secondary | ICD-10-CM | POA: Diagnosis present

## 2013-12-12 DIAGNOSIS — E785 Hyperlipidemia, unspecified: Secondary | ICD-10-CM | POA: Diagnosis present

## 2013-12-12 DIAGNOSIS — I10 Essential (primary) hypertension: Secondary | ICD-10-CM

## 2013-12-12 DIAGNOSIS — Z66 Do not resuscitate: Secondary | ICD-10-CM | POA: Diagnosis present

## 2013-12-12 DIAGNOSIS — E7220 Disorder of urea cycle metabolism, unspecified: Secondary | ICD-10-CM | POA: Diagnosis present

## 2013-12-12 DIAGNOSIS — D696 Thrombocytopenia, unspecified: Secondary | ICD-10-CM | POA: Diagnosis present

## 2013-12-12 DIAGNOSIS — R791 Abnormal coagulation profile: Secondary | ICD-10-CM | POA: Diagnosis present

## 2013-12-12 DIAGNOSIS — R638 Other symptoms and signs concerning food and fluid intake: Secondary | ICD-10-CM

## 2013-12-12 DIAGNOSIS — Z91199 Patient's noncompliance with other medical treatment and regimen due to unspecified reason: Secondary | ICD-10-CM

## 2013-12-12 DIAGNOSIS — I129 Hypertensive chronic kidney disease with stage 1 through stage 4 chronic kidney disease, or unspecified chronic kidney disease: Secondary | ICD-10-CM | POA: Diagnosis present

## 2013-12-12 DIAGNOSIS — Z87891 Personal history of nicotine dependence: Secondary | ICD-10-CM

## 2013-12-12 LAB — URINALYSIS, ROUTINE W REFLEX MICROSCOPIC
Bilirubin Urine: NEGATIVE
Glucose, UA: NEGATIVE mg/dL
Hgb urine dipstick: NEGATIVE
KETONES UR: NEGATIVE mg/dL
Leukocytes, UA: NEGATIVE
Nitrite: NEGATIVE
PH: 6 (ref 5.0–8.0)
Protein, ur: NEGATIVE mg/dL
Specific Gravity, Urine: 1.015 (ref 1.005–1.030)
Urobilinogen, UA: 0.2 mg/dL (ref 0.0–1.0)

## 2013-12-12 LAB — COMPREHENSIVE METABOLIC PANEL
ALBUMIN: 3.1 g/dL — AB (ref 3.5–5.2)
ALK PHOS: 133 U/L — AB (ref 39–117)
ALT: 20 U/L (ref 0–53)
AST: 34 U/L (ref 0–37)
BILIRUBIN TOTAL: 2.2 mg/dL — AB (ref 0.3–1.2)
BUN: 47 mg/dL — ABNORMAL HIGH (ref 6–23)
CO2: 19 meq/L (ref 19–32)
Calcium: 9 mg/dL (ref 8.4–10.5)
Chloride: 106 mEq/L (ref 96–112)
Creatinine, Ser: 2.31 mg/dL — ABNORMAL HIGH (ref 0.50–1.35)
GFR calc Af Amer: 31 mL/min — ABNORMAL LOW (ref >90)
GFR, EST NON AFRICAN AMERICAN: 27 mL/min — AB (ref >90)
Glucose, Bld: 123 mg/dL — ABNORMAL HIGH (ref 70–99)
POTASSIUM: 3.9 meq/L (ref 3.7–5.3)
SODIUM: 141 meq/L (ref 137–147)
Total Protein: 6.3 g/dL (ref 6.0–8.3)

## 2013-12-12 LAB — RAPID URINE DRUG SCREEN, HOSP PERFORMED
AMPHETAMINES: NOT DETECTED
Barbiturates: NOT DETECTED
Benzodiazepines: NOT DETECTED
Cocaine: NOT DETECTED
Opiates: NOT DETECTED
Tetrahydrocannabinol: NOT DETECTED

## 2013-12-12 LAB — CBC WITH DIFFERENTIAL/PLATELET
Basophils Absolute: 0 10*3/uL (ref 0.0–0.1)
Basophils Relative: 1 % (ref 0–1)
EOS ABS: 0.1 10*3/uL (ref 0.0–0.7)
Eosinophils Relative: 2 % (ref 0–5)
HCT: 30.7 % — ABNORMAL LOW (ref 39.0–52.0)
Hemoglobin: 10.5 g/dL — ABNORMAL LOW (ref 13.0–17.0)
Lymphocytes Relative: 11 % — ABNORMAL LOW (ref 12–46)
Lymphs Abs: 0.4 10*3/uL — ABNORMAL LOW (ref 0.7–4.0)
MCH: 33.1 pg (ref 26.0–34.0)
MCHC: 34.2 g/dL (ref 30.0–36.0)
MCV: 96.8 fL (ref 78.0–100.0)
Monocytes Absolute: 0.3 10*3/uL (ref 0.1–1.0)
Monocytes Relative: 10 % (ref 3–12)
NEUTROS PCT: 76 % (ref 43–77)
Neutro Abs: 2.6 10*3/uL (ref 1.7–7.7)
PLATELETS: 42 10*3/uL — AB (ref 150–400)
RBC: 3.17 MIL/uL — AB (ref 4.22–5.81)
RDW: 17.2 % — ABNORMAL HIGH (ref 11.5–15.5)
WBC: 3.4 10*3/uL — ABNORMAL LOW (ref 4.0–10.5)

## 2013-12-12 LAB — TROPONIN I: Troponin I: <0.3 ng/mL (ref <0.30)

## 2013-12-12 LAB — PROTIME-INR
INR: 1.59 — AB (ref 0.00–1.49)
PROTHROMBIN TIME: 18.5 s — AB (ref 11.6–15.2)

## 2013-12-12 LAB — AMMONIA: Ammonia: 89 umol/L — ABNORMAL HIGH (ref 11–60)

## 2013-12-12 LAB — PRO B NATRIURETIC PEPTIDE: PRO B NATRI PEPTIDE: 604.9 pg/mL — AB (ref 0–125)

## 2013-12-12 LAB — LIPASE, BLOOD: Lipase: 31 U/L (ref 11–59)

## 2013-12-12 LAB — ETHANOL: Alcohol, Ethyl (B): <11 mg/dL (ref 0–11)

## 2013-12-12 MED ORDER — SODIUM CHLORIDE 0.9 % IV SOLN
INTRAVENOUS | Status: AC
  Administered 2013-12-12: 19:00:00 via INTRAVENOUS

## 2013-12-12 MED ORDER — CIPROFLOXACIN HCL 250 MG PO TABS
500.0000 mg | ORAL_TABLET | Freq: Every day | ORAL | Status: DC
  Administered 2013-12-12 – 2013-12-15 (×3): 500 mg via ORAL
  Filled 2013-12-12 (×3): qty 2

## 2013-12-12 MED ORDER — DOXAZOSIN MESYLATE 2 MG PO TABS
4.0000 mg | ORAL_TABLET | Freq: Every day | ORAL | Status: DC
  Administered 2013-12-12 – 2013-12-15 (×3): 4 mg via ORAL
  Filled 2013-12-12 (×3): qty 2

## 2013-12-12 MED ORDER — FUROSEMIDE 40 MG PO TABS
40.0000 mg | ORAL_TABLET | Freq: Every day | ORAL | Status: DC
  Administered 2013-12-12: 40 mg via ORAL
  Filled 2013-12-12: qty 1

## 2013-12-12 MED ORDER — ACETAMINOPHEN 650 MG RE SUPP: 650.0000 mg | Freq: Four times a day (QID) | RECTAL | Status: DC | PRN

## 2013-12-12 MED ORDER — VITAMIN K1 10 MG/ML IJ SOLN
10.0000 mg | Freq: Once | INTRAMUSCULAR | Status: AC
  Administered 2013-12-12: 10 mg via INTRAVENOUS
  Filled 2013-12-12: qty 1

## 2013-12-12 MED ORDER — ONDANSETRON HCL 4 MG/2ML IJ SOLN: 4.0000 mg | Freq: Four times a day (QID) | INTRAMUSCULAR | Status: DC | PRN

## 2013-12-12 MED ORDER — MEGESTROL ACETATE 400 MG/10ML PO SUSP
200.0000 mg | Freq: Every day | ORAL | Status: DC
  Administered 2013-12-12 – 2013-12-15 (×3): 200 mg via ORAL
  Filled 2013-12-12 (×3): qty 10

## 2013-12-12 MED ORDER — LACTULOSE 10 GM/15ML PO SOLN
20.0000 g | Freq: Three times a day (TID) | ORAL | Status: DC
  Administered 2013-12-12 – 2013-12-15 (×7): 20 g via ORAL
  Filled 2013-12-12 (×7): qty 30

## 2013-12-12 MED ORDER — PANTOPRAZOLE SODIUM 40 MG PO TBEC
40.0000 mg | DELAYED_RELEASE_TABLET | Freq: Every day | ORAL | Status: DC
  Administered 2013-12-12 – 2013-12-15 (×3): 40 mg via ORAL
  Filled 2013-12-12 (×3): qty 1

## 2013-12-12 MED ORDER — PROPRANOLOL HCL 20 MG PO TABS: 80.0000 mg | ORAL_TABLET | Freq: Every day | ORAL | Status: DC

## 2013-12-12 MED ORDER — ALLOPURINOL 100 MG PO TABS
100.0000 mg | ORAL_TABLET | Freq: Every day | ORAL | Status: DC
  Administered 2013-12-12 – 2013-12-15 (×3): 100 mg via ORAL
  Filled 2013-12-12 (×3): qty 1

## 2013-12-12 MED ORDER — PROPRANOLOL HCL ER 80 MG PO CP24
80.0000 mg | ORAL_CAPSULE | Freq: Every day | ORAL | Status: DC
  Filled 2013-12-12 (×2): qty 1

## 2013-12-12 MED ORDER — ONDANSETRON HCL 4 MG PO TABS: 4.0000 mg | ORAL_TABLET | Freq: Four times a day (QID) | ORAL | Status: DC | PRN

## 2013-12-12 MED ORDER — VITAMIN K1 10 MG/ML IJ SOLN
INTRAMUSCULAR | Status: AC
  Filled 2013-12-12: qty 1

## 2013-12-12 MED ORDER — SODIUM CHLORIDE 0.9 % IV SOLN
INTRAVENOUS | Status: DC
  Administered 2013-12-12 – 2013-12-14 (×2): via INTRAVENOUS

## 2013-12-12 MED ORDER — SODIUM CHLORIDE 0.9 % IV BOLUS (SEPSIS)
500.0000 mL | Freq: Once | INTRAVENOUS | Status: AC
  Administered 2013-12-12: 500 mL via INTRAVENOUS

## 2013-12-12 MED ORDER — ACETAMINOPHEN 325 MG PO TABS: 650.0000 mg | ORAL_TABLET | Freq: Four times a day (QID) | ORAL | Status: DC | PRN
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
## 2013-12-12 NOTE — ED Notes (Addendum)
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 Brought in by EMS, pt last had paracentesis last week, appearance of abdomen is the same as before paracentesis. Pt usually has elevated ammonia levels.

## 2013-12-12 NOTE — Progress Notes (Signed)
Agree with Ms Lydia Guiles note.  Hosie Poisson.
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
## 2013-12-13 ENCOUNTER — Inpatient Hospital Stay (HOSPITAL_COMMUNITY): Payer: Medicare HMO

## 2013-12-13 DIAGNOSIS — K7682 Hepatic encephalopathy: Secondary | ICD-10-CM

## 2013-12-13 DIAGNOSIS — D696 Thrombocytopenia, unspecified: Secondary | ICD-10-CM

## 2013-12-13 DIAGNOSIS — I10 Essential (primary) hypertension: Secondary | ICD-10-CM

## 2013-12-13 DIAGNOSIS — R188 Other ascites: Secondary | ICD-10-CM

## 2013-12-13 DIAGNOSIS — K7460 Unspecified cirrhosis of liver: Secondary | ICD-10-CM

## 2013-12-13 DIAGNOSIS — K7290 Hepatic failure, unspecified without coma: Secondary | ICD-10-CM

## 2013-12-13 LAB — COMPREHENSIVE METABOLIC PANEL
ALT: 19 U/L (ref 0–53)
AST: 35 U/L (ref 0–37)
Albumin: 2.8 g/dL — ABNORMAL LOW (ref 3.5–5.2)
Alkaline Phosphatase: 127 U/L — ABNORMAL HIGH (ref 39–117)
BUN: 43 mg/dL — ABNORMAL HIGH (ref 6–23)
CALCIUM: 8.6 mg/dL (ref 8.4–10.5)
CO2: 18 meq/L — AB (ref 19–32)
CREATININE: 2.15 mg/dL — AB (ref 0.50–1.35)
Chloride: 110 mEq/L (ref 96–112)
GFR, EST AFRICAN AMERICAN: 34 mL/min — AB (ref >90)
GFR, EST NON AFRICAN AMERICAN: 29 mL/min — AB (ref >90)
GLUCOSE: 127 mg/dL — AB (ref 70–99)
Potassium: 3.9 mEq/L (ref 3.7–5.3)
SODIUM: 143 meq/L (ref 137–147)
TOTAL PROTEIN: 5.6 g/dL — AB (ref 6.0–8.3)
Total Bilirubin: 2.2 mg/dL — ABNORMAL HIGH (ref 0.3–1.2)

## 2013-12-13 LAB — PROTIME-INR
INR: 1.8 — AB (ref 0.00–1.49)
PROTHROMBIN TIME: 20.4 s — AB (ref 11.6–15.2)

## 2013-12-13 LAB — BODY FLUID CELL COUNT WITH DIFFERENTIAL: WBC FLUID: 6 uL (ref 0–1000)

## 2013-12-13 LAB — CBC
HEMATOCRIT: 28.4 % — AB (ref 39.0–52.0)
HEMOGLOBIN: 9.7 g/dL — AB (ref 13.0–17.0)
MCH: 33.3 pg (ref 26.0–34.0)
MCHC: 34.2 g/dL (ref 30.0–36.0)
MCV: 97.6 fL (ref 78.0–100.0)
Platelets: 39 10*3/uL — ABNORMAL LOW (ref 150–400)
RBC: 2.91 MIL/uL — AB (ref 4.22–5.81)
RDW: 17.5 % — ABNORMAL HIGH (ref 11.5–15.5)
WBC: 2.9 10*3/uL — ABNORMAL LOW (ref 4.0–10.5)

## 2013-12-13 LAB — AMMONIA
AMMONIA: 143 umol/L — AB (ref 11–60)
Ammonia: 45 umol/L (ref 11–60)

## 2013-12-13 MED ORDER — ALBUMIN HUMAN 25 % IV SOLN
INTRAVENOUS | Status: AC
  Filled 2013-12-13: qty 200

## 2013-12-13 MED ORDER — PROPRANOLOL HCL 20 MG PO TABS
10.0000 mg | ORAL_TABLET | Freq: Two times a day (BID) | ORAL | Status: DC
  Administered 2013-12-14 – 2013-12-15 (×4): 10 mg via ORAL
  Filled 2013-12-13 (×4): qty 1

## 2013-12-13 MED ORDER — ALBUMIN HUMAN 25 % IV SOLN
50.0000 g | Freq: Once | INTRAVENOUS | Status: AC
  Administered 2013-12-14: 50 g via INTRAVENOUS
  Filled 2013-12-13: qty 200
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
## 2013-12-13 NOTE — Care Management Note (Signed)
    Page 1 of 1   12/13/2013     2:20:08 PM   CARE MANAGEMENT NOTE 12/13/2013  Patient:  Charles Hull, Charles Hull   Account Number:  [id number]  Date Initiated:  12/13/2013  Documentation initiated by:  Theophilus Kinds  Subjective/Objective Assessment:   Pt admitted from home with hepatic encphalopathy. Pt lives with his wife and will return home at discharge. Pt has a BSC for home use. Pt is having gradual weakness.     Action/Plan:   Will continue to follow for discharge planning needs.   Anticipated DC Date:  12/15/2013   Anticipated DC Plan:  [place]  CM consult      Choice offered to / List presented to:             Status of service:  Completed, signed off Medicare Important Message given?   (If response is "NO", the following Medicare IM given date fields will be blank) Date Medicare IM given:   Date Additional Medicare IM given:    Discharge Disposition:  HOME/SELF CARE  Per UR Regulation:    If discussed at Long Length of Stay Meetings, dates discussed:    Comments:  12/13/13 [place], RN BSN CM

## 2013-12-13 NOTE — Progress Notes (Signed)
[facility] PROGRESS NOTE  Charles Hull FKC:[id number] DOB: [birth year] DOA: 12/12/2013 PCP: Shanda Howells, MD  Assessment/Plan: 1. Hepatic encephalopathy ammonia is significantly elevated from admission. We'll continue with lactulose. Precipitating factors likely patient's noncompliance. Continue to follow serum ammonia levels. 2. Ascites. Patient to undergo a therapeutic paracentesis today. 3. Chronic kidney disease stage III. Creatinine is mildly improved from yesterday, I suspect this is near his baseline. 4. 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 Not a candidate for transplant or TIPS. Dr. Laural Golden following. 5. Generalized weakness. Will hopefully improve once encephalopathy has improved  Code Status: DO NOT RESUSCITATE Family Communication: Discussed with patient and his wife Disposition Plan: Pending hospital course   Consultants:  Gastroenterology, Dr. Laural Golden  Procedures:  Paracentesis scheduled for today  Antibiotics:  Cipro 500 mg daily for SBP prophylaxis  HPI/Subjective: Does not have any new complaints. Reports having a small bowel movement. Still feels tired and weak  Objective: Filed Vitals:   12/13/13 1545  BP: 126/66  Pulse: 100  Temp:   Resp: 18    Intake/Output Summary (Last 24 hours) at 12/13/13 1854 Last data filed at 12/13/13 1803  Gross per 24 hour  Intake    120 ml  Output      0 ml  Net    120 ml   Filed Weights   12/12/13 1853 12/13/13 0500  Weight: 71.487 kg (157 lb 9.6 oz) 70.534 kg (155 lb 8 oz)    Exam:   General:  Somnolent but easily wakes up. Does not appear to be in any distress  Cardiovascular: S1, S2, regular rate and rhythm  Respiratory: Clear to auscultation bilaterally  Abdomen: Soft, distended, nontender, positive bowel sounds  Musculoskeletal: 1+ edema lower extremities bilaterally  Data Reviewed: Basic Metabolic Panel:  Recent Labs Lab 12/12/13 1508 12/13/13 0615  NA 141 143  K 3.9 3.9  CL 106 110   CO2 19 18*  GLUCOSE 123* 127*  BUN 47* 43*  CREATININE 2.31* 2.15*  CALCIUM 9.0 8.6   Liver Function Tests:  Recent Labs Lab 12/12/13 1508 12/13/13 0615  AST 34 35  ALT 20 19  ALKPHOS 133* 127*  BILITOT 2.2* 2.2*  PROT 6.3 5.6*  ALBUMIN 3.1* 2.8*    Recent Labs Lab 12/12/13 1508  LIPASE 31    Recent Labs Lab 12/12/13 1511 12/13/13 0615  AMMONIA 89* 143*   CBC:  Recent Labs Lab 12/12/13 1508 12/13/13 0615  WBC 3.4* 2.9*  NEUTROABS 2.6  --   HGB 10.5* 9.7*  HCT 30.7* 28.4*  MCV 96.8 97.6  PLT 42* 39*   Cardiac Enzymes:  Recent Labs Lab 12/12/13 1508  TROPONINI <0.30   BNP (last 3 results)  Recent Labs  12/12/13 1508  PROBNP 604.9*   CBG: No results found for this basename: GLUCAP,  in the last 168 hours  Recent Results (from the past 240 hour(s))  GRAM STAIN     Status: None   Collection Time    12/13/13  3:28 PM      Result Value Range Status   Specimen Description FLUID ASCITIC   Final   Special Requests NONE   Final   Gram Stain     Final   Value: CYTOSPIN = WBC PRESENT, PREDOMINANTLY MONONUCLEAR NO ORGANISMS SEEN     Performed at [facility]   Report Status PENDING   Incomplete     Studies: Ct Head  Wo Contrast  12/12/2013   CLINICAL DATA:  Altered mental status  EXAM: CT HEAD WITHOUT CONTRAST  TECHNIQUE: Contiguous axial images were obtained from the base of the skull through the vertex without intravenous contrast. Study was obtained within 24 hr of patient's arrival at the emergency department.  COMPARISON:  June 07, 2010  FINDINGS: Ventricles are normal in size and configuration. There is no mass, hemorrhage, extra-axial fluid collection, or midline shift. Gray-white compartments appear within normal limits. There is no demonstrable acute infarct.  Bony calvarium appears intact. There is diffuse opacification of mastoids on the right, a stable finding. There is mucosal  thickening in the left maxillary antrum with a retention cyst inferiorly.  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 Left maxillary sinus disease, not present on prior study.  No intracranial lesion appreciated. In particular, no intracranial mass, hemorrhage, or acute appearing infarct.   Electronically Signed   By: Lowella Grip M.D.   On: 12/12/2013 16:30   US Paracentesis  12/13/2013   CLINICAL DATA:  Cirrhosis, ascites  EXAM: ULTRASOUND GUIDED THERAPEUTIC PARACENTESIS  COMPARISON:  12/01/2013  PROCEDURE: Procedure, benefits, and risks of procedure were discussed with patient.  Written informed consent for procedure was obtained.  Time out protocol followed.  Adequate collection of ascites localized in right lower quadrant.  Skin prepped and draped in usual sterile fashion.  Skin and soft tissues anesthetized with 10 mL of 1% lidocaine.  5 [place] Yueh catheter placed into peritoneal cavity.  4000 mL of peach-colored fluid aspirated by vacuum bottle suction.  Procedure tolerated well by patient without immediate complication.  FINDINGS: A total of approximately 4000 mL of peach-colored fluid was removed. A fluid sample was sent for laboratory analysis.  IMPRESSION: Successful ultrasound guided paracentesis yielding 4000 mL of ascites.   Electronically Signed   By: Lavonia Dana M.D.   On: 12/13/2013 18:21   Dg Abd Acute W/chest  12/12/2013   CLINICAL DATA:  Weakness  EXAM: ACUTE ABDOMEN SERIES (ABDOMEN 2 VIEW & CHEST 1 VIEW)  COMPARISON:  11/29/2013  FINDINGS: Cardiac shadow is stable. Persistent left-sided effusion and underlying atelectasis is noted and slightly increased. The right lung remains clear. Scattered large and small bowel gas is noted. No free air is noted. Postsurgical changes are seen.  IMPRESSION: Increasing left-sided effusion and atelectasis.   Electronically Signed   By: Inez Catalina M.D.   On: 12/12/2013 16:19    Scheduled Meds: . albumin human  50 g Intravenous Once  .  allopurinol  100 mg Oral Daily  . ciprofloxacin  500 mg Oral Q breakfast  . doxazosin  4 mg Oral Daily  . lactulose  20 g Oral TID  . megestrol  200 mg Oral Daily  . pantoprazole  40 mg Oral Daily  . propranolol  10 mg Oral BID   Continuous Infusions: . sodium chloride 75 mL/hr at 12/12/13 2215    Active Problems:   Cirrhosis of liver without mention of alcohol   Ascites   Weakness   Cirrhosis of liver   Encephalopathy, hepatic   CKD (chronic kidney disease) stage 3, GFR 30-59 ml/min   Thrombocytopenia, unspecified   Coagulopathy   Hepatic encephalopathy    Time spent: 95mins    Bijou Easler  [facility] Pager [phone]. If 7PM-7AM, please contact night-coverage at [email or web address], password [facility] 12/13/2013, 6:54 PM  LOS: 1 day

## 2013-12-13 NOTE — Progress Notes (Signed)
Paracentesis complete no signs of distress. 4000 ml peach colored abdominal fluid removed.

## 2013-12-13 NOTE — Progress Notes (Signed)
UR chart review completed.  

## 2013-12-13 NOTE — Procedures (Signed)
PreOperative Dx: Cirrhosis, ascites Postoperative Dx: Cirrhosis, ascites Procedure:   US guided paracentesis Radiologist:  Thornton Papas Anesthesia:  10 ml of 1% lidocaine Specimen:  4000 ml of peach-colored ascitic fluid EBL:   None Complications: None

## 2013-12-14 ENCOUNTER — Inpatient Hospital Stay: Admit: 2013-12-14 | Payer: Medicare HMO

## 2013-12-14 LAB — GRAM STAIN: Gram Stain: NONE SEEN

## 2013-12-14 LAB — BASIC METABOLIC PANEL
BUN: 39 mg/dL — AB (ref 6–23)
CO2: 18 mEq/L — ABNORMAL LOW (ref 19–32)
CREATININE: 2.05 mg/dL — AB (ref 0.50–1.35)
Calcium: 8.7 mg/dL (ref 8.4–10.5)
Chloride: 113 mEq/L — ABNORMAL HIGH (ref 96–112)
GFR, EST AFRICAN AMERICAN: 36 mL/min — AB (ref >90)
GFR, EST NON AFRICAN AMERICAN: 31 mL/min — AB (ref >90)
Glucose, Bld: 100 mg/dL — ABNORMAL HIGH (ref 70–99)
Potassium: 3 mEq/L — ABNORMAL LOW (ref 3.7–5.3)
Sodium: 146 mEq/L (ref 137–147)

## 2013-12-14 LAB — CBC
HCT: 23.9 % — ABNORMAL LOW (ref 39.0–52.0)
Hemoglobin: 8.1 g/dL — ABNORMAL LOW (ref 13.0–17.0)
MCH: 33.2 pg (ref 26.0–34.0)
MCHC: 33.9 g/dL (ref 30.0–36.0)
MCV: 98 fL (ref 78.0–100.0)
Platelets: 31 10*3/uL — ABNORMAL LOW (ref 150–400)
RBC: 2.44 MIL/uL — ABNORMAL LOW (ref 4.22–5.81)
RDW: 17.5 % — AB (ref 11.5–15.5)
WBC: 2.1 10*3/uL — ABNORMAL LOW (ref 4.0–10.5)

## 2013-12-14 LAB — MAGNESIUM: Magnesium: 2.3 mg/dL (ref 1.5–2.5)

## 2013-12-14 MED ORDER — POTASSIUM CHLORIDE CRYS ER 20 MEQ PO TBCR
40.0000 meq | EXTENDED_RELEASE_TABLET | Freq: Two times a day (BID) | ORAL | Status: AC
  Administered 2013-12-14 (×2): 40 meq via ORAL
  Filled 2013-12-14 (×2): qty 2

## 2013-12-14 NOTE — Progress Notes (Signed)
Subjective; Patient states he had a better day today. He did do ambulate in the hall for about 250 feet. He denies nausea or vomiting. His appetite is not as good as it was yesterday. He denies melena or rectal bleeding. He continues to complain of weakness. He is anxious to go home.  Objective; BP 108/54  Pulse 97  Temp(Src) 98 F (36.7 C) (Oral)  Resp 20  Ht 5\' 9"  (1.753 m)  Wt 159 lb 9.8 oz (72.4 kg)  BMI 23.56 kg/m2  SpO2 98% Patient is alert and does not have asterixis today. Abdomen is distended but soft and nontender. He has 1-2+ pitting edema involving both legs.  Lab data; Ascitic fluid analysis from yesterday. Straw-colored fluid with 6 WBCs Cultures remain negative.  WBC 2.1, H&H 8.1 and 23.9 and platelet count 31K Serum sodium 146, potassium 3.0, upright 113, CO2 18, BUN 39 and creatinine 2.05 Serum ammonia from last evening 45.  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 He underwent abdominal paracentesis yesterday with removal of 4 L of fluid. Cultures remain negative. Serum ammonia is down and he does not have asterixis. Hemoglobin is drifting down but no evidence of active bleed. He may be losing some blood into peritoneal cavity since his abdominal tap about 12 days ago was hemorrhagic. Patient's condition reviewed with his wife. Patient is not a candidate for liver transplant and we only left with supportive measures . Since weakness is one of his main complaints may consider stopping propranolol. Patient is on KCl to correct hypokalemia.   Marland Kitchen

## 2013-12-14 NOTE — Progress Notes (Signed)
[facility] PROGRESS NOTE  Charles Hull PIR:[id number] DOB: 05-Jul-1941 DOA: 12/12/2013 PCP: Shanda Howells, MD  Summary: 73 year old man with history of cirrhosis, NASH, noncompliant with lactulose at home presented with worsening generalized weakness. He was admitted for hyperammonemia, hepatic encephalopathy, ascites.  Assessment/Plan: 1. Hepatic encephalopathy believed to be secondary to progressive liver disease also secondary to noncompliance with lactulose. MELD 23. Not a candidate for liver transplant or TIPS per GI. GI evaluation appreciated. Encephalopathy has resolved. Continue lactulose. 2. Ascites secondary to underlying liver disease. Continue Cipro for SBP prophylaxis. Coagulopathy secondary to underlying liver disease. status post large volume paracentesis. Culture pending, cell studies did not suggest infection. 3. Chronic kidney disease stage III, solitary kidney appears to be very close to baseline. BUN is trending downwards. 4. NAFLD. As above. 5. Pancytopenia, associated with liver disease. Anemia slightly worse. Trend CBC. No evidence of ongoing bleeding. 6. DM. Fasting blood sugar this morning 100.   Continue lactulose as per GI.  Continue Cipro for SBP prophylaxis  SSI  Replace potassium  CBC, BMP in AM  Likely home within 48 hours  Code Status: DNR DVT prophylaxis: SCDs Family Communication: none present Disposition Plan: home  Murray Hodgkins, MD  [facility]  Pager [phone] If 7PM-7AM, please contact night-coverage at [email or web address], password [facility] 12/14/2013, 4:22 PM  LOS: 2 days   Consultants:  Gastroenterology  Procedures:  2/3 large volume paracentesis  Antibiotics:  Ciprofloxacin on admission for chronic SBP prophylaxis  HPI/Subjective: Ambulated well. Overall he is feeling better. No specific complaints at this time. No abdominal pain.  Objective: Filed Vitals:   12/14/13 0102 12/14/13 0143 12/14/13 0500 12/14/13 1512   BP: 128/74 135/71 127/56 115/52  Pulse: 104 102 107 106  Temp: 97.8 F (36.6 C) 97.9 F (36.6 C) 98.4 F (36.9 C) 98.2 F (36.8 C)  TempSrc: Oral Oral Oral Oral  Resp: 18 18 20 20   Height:      Weight:   72.4 kg (159 lb 9.8 oz)   SpO2: 98% 98% 98% 97%    Intake/Output Summary (Last 24 hours) at 12/14/13 1622 Last data filed at 12/14/13 1200  Gross per 24 hour  Intake   1422 ml  Output    400 ml  Net   1022 ml     Filed Weights   12/12/13 1853 12/13/13 0500 12/14/13 0500  Weight: 71.487 kg (157 lb 9.6 oz) 70.534 kg (155 lb 8 oz) 72.4 kg (159 lb 9.8 oz)    Exam:   Afebrile, vital signs stable. No hypoxia.  General: Appears calm and comfortable. Speech is fluent and clear. Nontoxic.  Cardiovascular: Regular rate and rhythm. No murmur, rub or gallop. 1+ bilateral lower extremity edema.  Respiratory: Clear to auscultation bilaterally. No wheezes rales or rhonchi. Normal respiratory effort.  Abdomen soft, distended.  Psychiatric grossly normal mood and affect. Speech fluent and appropriate.  Data Reviewed:  Potassium 3.0. Creatinine near baseline.  Magnesium normal.  Serum ammonia level not normal.  Pancytopenia appears to be stable although hemoglobin has trended down slightly.  Scheduled Meds: . allopurinol  100 mg Oral Daily  . ciprofloxacin  500 mg Oral Q breakfast  . doxazosin  4 mg Oral Daily  . lactulose  20 g Oral TID  . megestrol  200 mg Oral Daily  . pantoprazole  40 mg Oral Daily  . potassium chloride  40 mEq Oral BID  . propranolol  10 mg Oral BID   Continuous Infusions: . sodium chloride 75  mL/hr at 12/14/13 1256    Active Problems:   Cirrhosis of liver without mention of alcohol   Ascites   Weakness   Cirrhosis of liver   Encephalopathy, hepatic   CKD (chronic kidney disease) stage 3, GFR 30-59 ml/min   Thrombocytopenia, unspecified   Coagulopathy   Hepatic encephalopathy   Time spent 25 minutes

## 2013-12-14 NOTE — Progress Notes (Signed)
Pt ambulated in hallway with standby assist from NT. Pt ambulated approximately 250 feet.  Tolerated well with no complaints.

## 2013-12-15 ENCOUNTER — Telehealth (INDEPENDENT_AMBULATORY_CARE_PROVIDER_SITE_OTHER): Payer: Self-pay | Admitting: *Deleted

## 2013-12-15 LAB — BASIC METABOLIC PANEL
BUN: 33 mg/dL — AB (ref 6–23)
CALCIUM: 8.5 mg/dL (ref 8.4–10.5)
CO2: 17 mEq/L — ABNORMAL LOW (ref 19–32)
Chloride: 115 mEq/L — ABNORMAL HIGH (ref 96–112)
Creatinine, Ser: 1.75 mg/dL — ABNORMAL HIGH (ref 0.50–1.35)
GFR calc Af Amer: 43 mL/min — ABNORMAL LOW (ref >90)
GFR, EST NON AFRICAN AMERICAN: 37 mL/min — AB (ref >90)
Glucose, Bld: 109 mg/dL — ABNORMAL HIGH (ref 70–99)
Potassium: 3.8 mEq/L (ref 3.7–5.3)
Sodium: 144 mEq/L (ref 137–147)

## 2013-12-15 LAB — CBC
HCT: 26.1 % — ABNORMAL LOW (ref 39.0–52.0)
Hemoglobin: 8.9 g/dL — ABNORMAL LOW (ref 13.0–17.0)
MCH: 33.3 pg (ref 26.0–34.0)
MCHC: 34.1 g/dL (ref 30.0–36.0)
MCV: 97.8 fL (ref 78.0–100.0)
PLATELETS: 34 10*3/uL — AB (ref 150–400)
RBC: 2.67 MIL/uL — ABNORMAL LOW (ref 4.22–5.81)
RDW: 17.4 % — ABNORMAL HIGH (ref 11.5–15.5)
WBC: 2.6 10*3/uL — ABNORMAL LOW (ref 4.0–10.5)

## 2013-12-15 LAB — PATHOLOGIST SMEAR REVIEW

## 2013-12-15 MED ORDER — SODIUM BICARBONATE 650 MG PO TABS
650.0000 mg | ORAL_TABLET | Freq: Two times a day (BID) | ORAL | Status: DC
  Administered 2013-12-15: 650 mg via ORAL
  Filled 2013-12-15: qty 1

## 2013-12-15 MED ORDER — PROPRANOLOL HCL 10 MG PO TABS: 10.0000 mg | ORAL_TABLET | Freq: Two times a day (BID) | ORAL | Status: AC

## 2013-12-15 MED ORDER — LACTULOSE 10 GM/15ML PO SOLN: 10.0000 g | Freq: Two times a day (BID) | ORAL | Status: DC

## 2013-12-15 NOTE — Discharge Summary (Signed)
Physician Discharge Summary  Charles Hull [id number] DOB: 09-20-1941 DOA: 12/12/2013  PCP: Shanda Howells, MD  Admit date: 12/12/2013 Discharge date: 12/15/2013  Recommendations for Outpatient Follow-up:  1. Continue monitoring of ascites in prevention of hepatic encephalopathy. 2. Wife will call Dr. Olevia Perches office 2/9 to set up next paracentesis.  Follow-up Information   Follow up with Shanda Howells, MD.   Specialty:  [facility]   Contact information:   [phone] [address] [place] [address] [phone]      Discharge Diagnoses:  1. Hepatic encephalopathy 2. Ascites 3. Chronic kidney disease stage III. 4. NAFLD 5. Pancytopenia 6. DM  Discharge Condition: improved Disposition: home  Diet recommendation: low salt, diabetic diet  Filed Weights   12/13/13 0500 12/14/13 0500 12/15/13 0815  Weight: 70.534 kg (155 lb 8 oz) 72.4 kg (159 lb 9.8 oz) 71.079 kg (156 lb 11.2 oz)    History of present illness:  73 year old man with history of cirrhosis, NASH, noncompliant with lactulose at home presented with worsening generalized weakness. He was admitted for hyperammonemia, hepatic encephalopathy, ascites.  Hospital Course:  Patient was treated with lactulose and rapidly improved with resolution of hepatic encephalopathy. He was seen in consultation with gastroenterology, underwent large-volume paracentesis with improvement. Medications adjusted per Dr. Olevia Perches recommendations--case discussed today, he requests Lasix be discontinued as it is thought to be causing dehydration and confusion. Propranolol dose adjusted per recommendations. Patient stable for discharge. Individual issues as below. The patient's wife will call the office next week for followup.  1. Hepatic encephalopathy believed to be secondary to progressive liver disease also secondary to noncompliance with lactulose. Resolved. MELD 23. Not a candidate for liver transplant or TIPS per GI. GI evaluation  appreciated. Continue lactulose. 2. Ascites secondary to underlying liver disease. Continue Cipro for SBP prophylaxis. Coagulopathy secondary to underlying liver disease. status post large volume paracentesis. Culture pending no growth today, cell studies did not suggest infection. 3. Chronic kidney disease stage III, solitary kidney appears to be very close to baseline. Stable. 4. NAFLD. As above. 5. Pancytopenia, associated with liver disease. Anemia stable. No evidence of ongoing bleeding. 6. DM. Fasting blood sugar this morning 109. Continue lactulose.  Continue Cipro for SBP prophylaxis  Discharge home, f/u with GI as an outpatient  Consultants:  Gastroenterology Procedures:  2/3: large volume paracentesis Antibiotics:  Ciprofloxacin on admission for chronic SBP prophylaxis  Discharge Instructions  Discharge Orders   Future Appointments Provider Department Dept Phone   02/06/2014 3:30 PM Rogene Houston, MD [place] [phone]   06/01/2014 9:00 AM [facility] [phone]   Eat a light meal the night before the exam Nothing to eat or drink for at least 8 hours before exam No gum chewing, or smoking the morning of the exam. Please take your morning medications with small sips of water, especially blood pressure medication *Very Important* Please wear 2 piece clothing   06/01/2014 9:30 AM [facility] [place] [facility] [address] [id number]   06/01/2014 10:30 AM Elam Dutch, MD [facility] [phone]   Future Orders Complete By Expires   Activity as tolerated - No restrictions  As directed    Diet - low sodium heart healthy  As directed    Discharge instructions  As directed    Comments:     Call physician or seek immediate medical attention for increasing weight,, shortness of breath or worsening of  condition. Dr. Dorien Chihuahua recommends stopping Lasix.       Medication  List    STOP taking these medications       furosemide 40 MG tablet  Commonly known as:  LASIX     propranolol ER 80 MG 24 hr capsule  Commonly known as:  INDERAL LA  Replaced by:  propranolol 10 MG tablet     vitamin B-12 1000 MCG tablet  Commonly known as:  CYANOCOBALAMIN      TAKE these medications       allopurinol 100 MG tablet  Commonly known as:  ZYLOPRIM  Take 100 mg by mouth daily.     ciprofloxacin 500 MG tablet  Commonly known as:  CIPRO  TAKE ONE TABLET BY MOUTH ONCE DAILY     D3-1000 1000 UNITS capsule  Generic drug:  Cholecalciferol  Take 1,000 Units by mouth daily.     doxazosin 4 MG tablet  Commonly known as:  CARDURA  Take 4 mg by mouth daily.     lactulose 10 GM/15ML solution  Commonly known as:  CHRONULAC  Take 15 mLs (10 g total) by mouth 2 (two) times daily.     megestrol 400 MG/10ML suspension  Commonly known as:  MEGACE  Take 5 mLs (200 mg total) by mouth daily.     NON FORMULARY  IRON 45 MG  SLOW RELEASE     ONE TABLET DAILY     pantoprazole 40 MG tablet  Commonly known as:  PROTONIX  TAKE ONE TABLET BY MOUTH IN THE MORNING     propranolol 10 MG tablet  Commonly known as:  INDERAL  Take 1 tablet (10 mg total) by mouth 2 (two) times daily.       Allergies  Allergen Reactions  . Lipitor [Atorvastatin Calcium]     The results of significant diagnostics from this hospitalization (including imaging, microbiology, ancillary and laboratory) are listed below for reference.    Significant Diagnostic Studies: Ct Head Wo Contrast  12/12/2013   CLINICAL DATA:  Altered mental status  EXAM: CT HEAD WITHOUT CONTRAST  TECHNIQUE: Contiguous axial images were obtained from the base of the skull through the vertex without intravenous contrast. Study was obtained within 24 hr of patient's arrival at the emergency department.  COMPARISON:  June 07, 2010  FINDINGS: Ventricles are normal in size and configuration. There is no mass, hemorrhage,  extra-axial fluid collection, or midline shift. Gray-white compartments appear within normal limits. There is no demonstrable acute infarct.  Bony calvarium appears intact. There is diffuse opacification of mastoids on the right, a stable finding. There is mucosal thickening in the left maxillary antrum with a retention cyst inferiorly.  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 Left maxillary sinus disease, not present on prior study.  No intracranial lesion appreciated. In particular, no intracranial mass, hemorrhage, or acute appearing infarct.   Electronically Signed   By: Lowella Grip M.D.   On: 12/12/2013 16:30   US Paracentesis  12/13/2013   CLINICAL DATA:  Cirrhosis, ascites  EXAM: ULTRASOUND GUIDED THERAPEUTIC PARACENTESIS  COMPARISON:  12/01/2013  PROCEDURE: Procedure, benefits, and risks of procedure were discussed with patient.  Written informed consent for procedure was obtained.  Time out protocol followed.  Adequate collection of ascites localized in right lower quadrant.  Skin prepped and draped in usual sterile fashion.  Skin and soft tissues anesthetized with 10 mL of 1% lidocaine.  5 [place] Yueh catheter placed into peritoneal cavity.  4000 mL of  peach-colored fluid aspirated by vacuum bottle suction.  Procedure tolerated well by patient without immediate complication.  FINDINGS: A total of approximately 4000 mL of peach-colored fluid was removed. A fluid sample was sent for laboratory analysis.  IMPRESSION: Successful ultrasound guided paracentesis yielding 4000 mL of ascites.   Electronically Signed   By: Lavonia Dana M.D.   On: 12/13/2013 18:21   Dg Abd Acute W/chest  12/12/2013   CLINICAL DATA:  Weakness  EXAM: ACUTE ABDOMEN SERIES (ABDOMEN 2 VIEW & CHEST 1 VIEW)  COMPARISON:  11/29/2013  FINDINGS: Cardiac shadow is stable. Persistent left-sided effusion and underlying atelectasis is noted and slightly increased. The right lung remains clear. Scattered large and small bowel gas  is noted. No free air is noted. Postsurgical changes are seen.  IMPRESSION: Increasing left-sided effusion and atelectasis.   Electronically Signed   By: Inez Catalina M.D.   On: 12/12/2013 16:19   Completely asymptomatic.   Microbiology: Recent Results (from the past 240 hour(s))  GRAM STAIN     Status: None   Collection Time    12/13/13  3:28 PM      Result Value Range Status   Specimen Description FLUID ASCITIC   Final   Special Requests NONE   Final   Gram Stain     Final   Value: CYTOSPIN NO WBC SEEN     NO ORGANISMS SEEN     Performed at [facility]   Report Status 12/14/2013 FINAL   Final  CULTURE, BODY FLUID-BOTTLE     Status: None   Collection Time    12/13/13  3:28 PM      Result Value Range Status   Specimen Description FLUID ASCITIC COLLECTED BY DOCTOR BOLES   Final   Special Requests BOTTLES DRAWN AEROBIC AND ANAEROBIC 10CC   Final   Culture NO GROWTH 2 DAYS   Final   Report Status PENDING   Incomplete     Labs: Basic Metabolic Panel:  Recent Labs Lab 12/12/13 1508 12/13/13 0615 12/14/13 0534 12/15/13 0540  NA 141 143 146 144  K 3.9 3.9 3.0* 3.8  CL 106 110 113* 115*  CO2 19 18* 18* 17*  GLUCOSE 123* 127* 100* 109*  BUN 47* 43* 39* 33*  CREATININE 2.31* 2.15* 2.05* 1.75*  CALCIUM 9.0 8.6 8.7 8.5  MG  --   --  2.3  --    Liver Function Tests:  Recent Labs Lab 12/12/13 1508 12/13/13 0615  AST 34 35  ALT 20 19  ALKPHOS 133* 127*  BILITOT 2.2* 2.2*  PROT 6.3 5.6*  ALBUMIN 3.1* 2.8*    Recent Labs Lab 12/12/13 1508  LIPASE 31    Recent Labs Lab 12/12/13 1511 12/13/13 0615 12/13/13 1956  AMMONIA 89* 143* 45   CBC:  Recent Labs Lab 12/12/13 1508 12/13/13 0615 12/14/13 0534 12/15/13 0540  WBC 3.4* 2.9* 2.1* 2.6*  NEUTROABS 2.6  --   --   --   HGB 10.5* 9.7* 8.1* 8.9*  HCT 30.7* 28.4* 23.9* 26.1*  MCV 96.8 97.6 98.0 97.8  PLT 42* 39* 31* 34*   Cardiac Enzymes:  Recent Labs Lab 12/12/13 1508  TROPONINI <0.30     Recent Labs  12/12/13 1508  PROBNP 604.9*    Active Problems:   Cirrhosis of liver without mention of alcohol   Ascites   Weakness   Cirrhosis of liver   Encephalopathy, hepatic   CKD (chronic kidney disease) stage 3, GFR 30-59 ml/min  Thrombocytopenia, unspecified   Coagulopathy   Hepatic encephalopathy   Time coordinating discharge: 35 minutes  Signed:  Murray Hodgkins, MD [facility] 12/15/2013, 3:45 PM

## 2013-12-15 NOTE — Telephone Encounter (Signed)
Per Dr.Rehman call to the [facility] in [place] - Pantoprazole 40 mg- Take 1 by mouth daily #30 with 5 refills. This was called to [employer].

## 2013-12-15 NOTE — Progress Notes (Signed)
Patient states he is at 14 or 15 stools in the last day and a half. He denies melena or rectal bleeding. His appetite is fair. He is alert and able to walk to the bathroom. Abdomen is distended but not tense. H&H is 8.9 and 26.1 Platelet count 34 K BUN 33 and creatinine 1.75 Ascitic fluid cultures remain negative to  Assessment; Decompensated liver disease with refractory ascites and hepatic encephalopathy. He also has pancytopenia. H&H is low but stable. Azotemia has improved with hydration. Patient's condition discussed with patient's wife. Long-term prognosis remains poor.  Recommendations; Decrease lactulose to 10 g bid. No more diuretic therapy. Agree with DC plans. Patient's wife will call office Monday and let us know if he needs abdominal paracenteses.

## 2013-12-15 NOTE — Progress Notes (Signed)
Patient discharged home with family.  Educated on changes to medications.  Instructed to make follow up with PCP and Dr. Laural Golden.  Verbalizes understanding of DC instructions.  IV removed - WNL.  No questions at this time.  Stable to DC home.

## 2013-12-15 NOTE — Progress Notes (Signed)
[facility] PROGRESS NOTE  Charles Hull JTT:[id number] DOB: [birth year] DOA: 12/12/2013 PCP: Shanda Howells, MD  Summary: 73 year old man with history of cirrhosis, NASH, noncompliant with lactulose at home presented with worsening generalized weakness. He was admitted for hyperammonemia, hepatic encephalopathy, ascites.  Assessment/Plan: 1. Hepatic encephalopathy believed to be secondary to progressive liver disease also secondary to noncompliance with lactulose. Resolved. MELD 23. Not a candidate for liver transplant or TIPS per GI. GI evaluation appreciated. Continue lactulose. 2. Ascites secondary to underlying liver disease. Continue Cipro for SBP prophylaxis. Coagulopathy secondary to underlying liver disease. status post large volume paracentesis. Culture pending no growth today, cell studies did not suggest infection. 3. Chronic kidney disease stage III, solitary kidney appears to be very close to baseline. Stable. 4. NAFLD. As above. 5. Pancytopenia, associated with liver disease. Anemia stable.  No evidence of ongoing bleeding. 6. DM. Fasting blood sugar this morning 109.   Continue lactulose.  Continue Cipro for SBP prophylaxis  Discharge home, f/u with GI as an outpatient  Code Status: DNR DVT prophylaxis: SCDs Family Communication: none present Disposition Plan: home  Murray Hodgkins, MD  [facility]  Pager [phone] If 7PM-7AM, please contact night-coverage at [email or web address], password [facility] 12/15/2013, 2:51 PM  LOS: 3 days   Consultants:  Gastroenterology  Procedures:  2/3: large volume paracentesis  Antibiotics:  Ciprofloxacin on admission for chronic SBP prophylaxis  HPI/Subjective: Feels better. No issues. Wants to go home.   Objective: Filed Vitals:   12/14/13 1512 12/14/13 1954 12/15/13 0630 12/15/13 0815  BP: 115/52 108/54 120/64   Pulse: 106 97 96   Temp: 98.2 F (36.8 C) 98 F (36.7 C) 98.6 F (37 C)   TempSrc: Oral Oral Oral    Resp: 20 20 20    Height:      Weight:    71.079 kg (156 lb 11.2 oz)  SpO2: 97% 98% 96%     Intake/Output Summary (Last 24 hours) at 12/15/13 1451 Last data filed at 12/14/13 1810  Gross per 24 hour  Intake 1011.25 ml  Output      0 ml  Net 1011.25 ml     Filed Weights   12/13/13 0500 12/14/13 0500 12/15/13 0815  Weight: 70.534 kg (155 lb 8 oz) 72.4 kg (159 lb 9.8 oz) 71.079 kg (156 lb 11.2 oz)    Exam:   Afebrile, vital signs stable. No hypoxia.  General: Appears calm and comfortable. Speech fluent and clear.  Cardiovascular regular rate and rhythm. No murmur, rub or gallop. 1+ bilateral lower extremity edema.  Respiratory clear to auscultation bilaterally. No wheezes, rales or rhonchi. Normal respiratory effort.  Grossly normal mood and affect. Speech fluent and appropriate.  Data Reviewed:  Potassium 3.8  Scheduled Meds: . allopurinol  100 mg Oral Daily  . ciprofloxacin  500 mg Oral Q breakfast  . doxazosin  4 mg Oral Daily  . lactulose  10 g Oral BID  . megestrol  200 mg Oral Daily  . pantoprazole  40 mg Oral Daily  . propranolol  10 mg Oral BID  . sodium bicarbonate  650 mg Oral BID   Continuous Infusions:    Active Problems:   Cirrhosis of liver without mention of alcohol   Ascites   Weakness   Cirrhosis of liver   Encephalopathy, hepatic   CKD (chronic kidney disease) stage 3, GFR 30-59 ml/min   Thrombocytopenia, unspecified   Coagulopathy   Hepatic encephalopathy

## 2013-12-16 ENCOUNTER — Other Ambulatory Visit (INDEPENDENT_AMBULATORY_CARE_PROVIDER_SITE_OTHER): Payer: Self-pay | Admitting: Internal Medicine

## 2013-12-16 DIAGNOSIS — Z0289 Encounter for other administrative examinations: Secondary | ICD-10-CM

## 2013-12-16 DIAGNOSIS — K7460 Unspecified cirrhosis of liver: Secondary | ICD-10-CM

## 2013-12-16 DIAGNOSIS — R188 Other ascites: Secondary | ICD-10-CM

## 2013-12-18 ENCOUNTER — Encounter (HOSPITAL_COMMUNITY): Payer: Self-pay | Admitting: Emergency Medicine

## 2013-12-18 ENCOUNTER — Emergency Department (HOSPITAL_COMMUNITY): Payer: Medicare HMO

## 2013-12-18 ENCOUNTER — Inpatient Hospital Stay (HOSPITAL_COMMUNITY)
Admission: EM | Admit: 2013-12-18 | Discharge: 2013-12-21 | DRG: 442 | Disposition: A | Discharge status: Hospice | Payer: Medicare HMO | Attending: Internal Medicine | Admitting: Internal Medicine

## 2013-12-18 DIAGNOSIS — K7290 Hepatic failure, unspecified without coma: Principal | ICD-10-CM | POA: Diagnosis present

## 2013-12-18 DIAGNOSIS — E860 Dehydration: Secondary | ICD-10-CM

## 2013-12-18 DIAGNOSIS — Z9089 Acquired absence of other organs: Secondary | ICD-10-CM

## 2013-12-18 DIAGNOSIS — D649 Anemia, unspecified: Secondary | ICD-10-CM

## 2013-12-18 DIAGNOSIS — K7682 Hepatic encephalopathy: Principal | ICD-10-CM | POA: Diagnosis present

## 2013-12-18 DIAGNOSIS — Z8249 Family history of ischemic heart disease and other diseases of the circulatory system: Secondary | ICD-10-CM

## 2013-12-18 DIAGNOSIS — E785 Hyperlipidemia, unspecified: Secondary | ICD-10-CM | POA: Diagnosis present

## 2013-12-18 DIAGNOSIS — Z87891 Personal history of nicotine dependence: Secondary | ICD-10-CM

## 2013-12-18 DIAGNOSIS — I714 Abdominal aortic aneurysm, without rupture, unspecified: Secondary | ICD-10-CM | POA: Diagnosis present

## 2013-12-18 DIAGNOSIS — E872 Acidosis, unspecified: Secondary | ICD-10-CM | POA: Diagnosis present

## 2013-12-18 DIAGNOSIS — I129 Hypertensive chronic kidney disease with stage 1 through stage 4 chronic kidney disease, or unspecified chronic kidney disease: Secondary | ICD-10-CM | POA: Diagnosis present

## 2013-12-18 DIAGNOSIS — I6529 Occlusion and stenosis of unspecified carotid artery: Secondary | ICD-10-CM

## 2013-12-18 DIAGNOSIS — D61818 Other pancytopenia: Secondary | ICD-10-CM | POA: Diagnosis present

## 2013-12-18 DIAGNOSIS — Z66 Do not resuscitate: Secondary | ICD-10-CM | POA: Diagnosis present

## 2013-12-18 DIAGNOSIS — E119 Type 2 diabetes mellitus without complications: Secondary | ICD-10-CM

## 2013-12-18 DIAGNOSIS — R531 Weakness: Secondary | ICD-10-CM

## 2013-12-18 DIAGNOSIS — D696 Thrombocytopenia, unspecified: Secondary | ICD-10-CM

## 2013-12-18 DIAGNOSIS — N183 Chronic kidney disease, stage 3 unspecified: Secondary | ICD-10-CM | POA: Diagnosis present

## 2013-12-18 DIAGNOSIS — Z905 Acquired absence of kidney: Secondary | ICD-10-CM

## 2013-12-18 DIAGNOSIS — K7460 Unspecified cirrhosis of liver: Secondary | ICD-10-CM | POA: Diagnosis present

## 2013-12-18 DIAGNOSIS — E7220 Disorder of urea cycle metabolism, unspecified: Secondary | ICD-10-CM | POA: Diagnosis present

## 2013-12-18 DIAGNOSIS — R188 Other ascites: Secondary | ICD-10-CM | POA: Diagnosis present

## 2013-12-18 DIAGNOSIS — Z9119 Patient's noncompliance with other medical treatment and regimen: Secondary | ICD-10-CM

## 2013-12-18 DIAGNOSIS — Z91199 Patient's noncompliance with other medical treatment and regimen due to unspecified reason: Secondary | ICD-10-CM

## 2013-12-18 DIAGNOSIS — D689 Coagulation defect, unspecified: Secondary | ICD-10-CM

## 2013-12-18 DIAGNOSIS — K7689 Other specified diseases of liver: Secondary | ICD-10-CM | POA: Diagnosis present

## 2013-12-18 DIAGNOSIS — I10 Essential (primary) hypertension: Secondary | ICD-10-CM

## 2013-12-18 HISTORY — DX: Other pancytopenia: D61.818

## 2013-12-18 LAB — COMPREHENSIVE METABOLIC PANEL
ALBUMIN: 3.5 g/dL (ref 3.5–5.2)
ALT: 27 U/L (ref 0–53)
AST: 41 U/L — ABNORMAL HIGH (ref 0–37)
Alkaline Phosphatase: 158 U/L — ABNORMAL HIGH (ref 39–117)
BUN: 36 mg/dL — ABNORMAL HIGH (ref 6–23)
CALCIUM: 9.1 mg/dL (ref 8.4–10.5)
CO2: 15 meq/L — AB (ref 19–32)
Chloride: 106 mEq/L (ref 96–112)
Creatinine, Ser: 2.01 mg/dL — ABNORMAL HIGH (ref 0.50–1.35)
GFR calc Af Amer: 37 mL/min — ABNORMAL LOW (ref >90)
GFR, EST NON AFRICAN AMERICAN: 32 mL/min — AB (ref >90)
Glucose, Bld: 143 mg/dL — ABNORMAL HIGH (ref 70–99)
Potassium: 4.7 mEq/L (ref 3.7–5.3)
SODIUM: 137 meq/L (ref 137–147)
TOTAL PROTEIN: 6.4 g/dL (ref 6.0–8.3)
Total Bilirubin: 2.4 mg/dL — ABNORMAL HIGH (ref 0.3–1.2)

## 2013-12-18 LAB — GLUCOSE, CAPILLARY
GLUCOSE-CAPILLARY: 135 mg/dL — AB (ref 70–99)
Glucose-Capillary: 121 mg/dL — ABNORMAL HIGH (ref 70–99)

## 2013-12-18 LAB — CBC WITH DIFFERENTIAL/PLATELET
BASOS ABS: 0 10*3/uL (ref 0.0–0.1)
Basophils Relative: 0 % (ref 0–1)
EOS PCT: 3 % (ref 0–5)
Eosinophils Absolute: 0.1 10*3/uL (ref 0.0–0.7)
HCT: 31.2 % — ABNORMAL LOW (ref 39.0–52.0)
Hemoglobin: 10.7 g/dL — ABNORMAL LOW (ref 13.0–17.0)
LYMPHS PCT: 8 % — AB (ref 12–46)
Lymphs Abs: 0.4 10*3/uL — ABNORMAL LOW (ref 0.7–4.0)
MCH: 33.6 pg (ref 26.0–34.0)
MCHC: 34.3 g/dL (ref 30.0–36.0)
MCV: 98.1 fL (ref 78.0–100.0)
Monocytes Absolute: 0.4 10*3/uL (ref 0.1–1.0)
Monocytes Relative: 8 % (ref 3–12)
Neutro Abs: 3.8 10*3/uL (ref 1.7–7.7)
Neutrophils Relative %: 81 % — ABNORMAL HIGH (ref 43–77)
Platelets: 44 10*3/uL — ABNORMAL LOW (ref 150–400)
RBC: 3.18 MIL/uL — ABNORMAL LOW (ref 4.22–5.81)
RDW: 17.8 % — AB (ref 11.5–15.5)
WBC: 4.7 10*3/uL (ref 4.0–10.5)

## 2013-12-18 LAB — URINALYSIS, ROUTINE W REFLEX MICROSCOPIC
Bilirubin Urine: NEGATIVE
GLUCOSE, UA: NEGATIVE mg/dL
Hgb urine dipstick: NEGATIVE
Leukocytes, UA: NEGATIVE
Nitrite: NEGATIVE
Protein, ur: NEGATIVE mg/dL
SPECIFIC GRAVITY, URINE: 1.025 (ref 1.005–1.030)
Urobilinogen, UA: 0.2 mg/dL (ref 0.0–1.0)
pH: 5.5 (ref 5.0–8.0)

## 2013-12-18 LAB — CULTURE, BODY FLUID-BOTTLE

## 2013-12-18 LAB — CULTURE, BODY FLUID W GRAM STAIN -BOTTLE: Culture: NO GROWTH

## 2013-12-18 LAB — AMMONIA: Ammonia: 113 umol/L — ABNORMAL HIGH (ref 11–60)

## 2013-12-18 MED ORDER — SODIUM CHLORIDE 0.9 % IV SOLN: 250.0000 mL | INTRAVENOUS | Status: DC | PRN

## 2013-12-18 MED ORDER — INSULIN ASPART 100 UNIT/ML ~~LOC~~ SOLN
0.0000 [IU] | Freq: Three times a day (TID) | SUBCUTANEOUS | Status: DC
  Administered 2013-12-18 – 2013-12-21 (×5): 1 [IU] via SUBCUTANEOUS

## 2013-12-18 MED ORDER — FUROSEMIDE 20 MG PO TABS
20.0000 mg | ORAL_TABLET | Freq: Every day | ORAL | Status: DC
  Administered 2013-12-18 – 2013-12-21 (×4): 20 mg via ORAL
  Filled 2013-12-18 (×4): qty 1

## 2013-12-18 MED ORDER — MEGESTROL ACETATE 400 MG/10ML PO SUSP
200.0000 mg | Freq: Two times a day (BID) | ORAL | Status: DC
  Administered 2013-12-18 – 2013-12-21 (×6): 200 mg via ORAL
  Filled 2013-12-18 (×6): qty 10

## 2013-12-18 MED ORDER — LACTULOSE 10 GM/15ML PO SOLN
10.0000 g | Freq: Three times a day (TID) | ORAL | Status: DC
  Administered 2013-12-18 – 2013-12-19 (×5): 10 g via ORAL
  Filled 2013-12-18 (×5): qty 30

## 2013-12-18 MED ORDER — PROPRANOLOL HCL 20 MG PO TABS
10.0000 mg | ORAL_TABLET | Freq: Two times a day (BID) | ORAL | Status: DC
  Administered 2013-12-18 – 2013-12-21 (×6): 10 mg via ORAL
  Filled 2013-12-18 (×6): qty 1

## 2013-12-18 MED ORDER — ONDANSETRON HCL 4 MG PO TABS: 4.0000 mg | ORAL_TABLET | Freq: Four times a day (QID) | ORAL | Status: DC | PRN

## 2013-12-18 MED ORDER — ONDANSETRON HCL 4 MG/2ML IJ SOLN: 4.0000 mg | Freq: Four times a day (QID) | INTRAMUSCULAR | Status: DC | PRN

## 2013-12-18 MED ORDER — PANTOPRAZOLE SODIUM 40 MG PO TBEC
40.0000 mg | DELAYED_RELEASE_TABLET | Freq: Every day | ORAL | Status: DC
  Administered 2013-12-19 – 2013-12-21 (×3): 40 mg via ORAL
  Filled 2013-12-18 (×3): qty 1

## 2013-12-18 MED ORDER — SODIUM CHLORIDE 0.9 % IJ SOLN: 3.0000 mL | INTRAMUSCULAR | Status: DC | PRN

## 2013-12-18 MED ORDER — ALLOPURINOL 100 MG PO TABS
100.0000 mg | ORAL_TABLET | Freq: Every day | ORAL | Status: DC
  Administered 2013-12-19 – 2013-12-21 (×3): 100 mg via ORAL
  Filled 2013-12-18 (×3): qty 1

## 2013-12-18 MED ORDER — CIPROFLOXACIN HCL 250 MG PO TABS
500.0000 mg | ORAL_TABLET | Freq: Every day | ORAL | Status: DC
  Administered 2013-12-19 – 2013-12-21 (×3): 500 mg via ORAL
  Filled 2013-12-18 (×3): qty 2

## 2013-12-18 MED ORDER — SODIUM CHLORIDE 0.9 % IJ SOLN
3.0000 mL | Freq: Two times a day (BID) | INTRAMUSCULAR | Status: DC
  Administered 2013-12-18 – 2013-12-21 (×4): 3 mL via INTRAVENOUS

## 2013-12-18 NOTE — ED Notes (Signed)
Pt presents to er with family for further evaluation of altered mental status, pt has hx of cirrhosis, was discharged from hospital 3 days ago, wife states that she noticed that he was confused, generalized weakness when she got him up this am, wife states that pt will "get like this" when his ammonia is elevated, pt able to answer question when asked where he is at, confused about what day it is, knows family at bedside,
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
## 2013-12-19 ENCOUNTER — Inpatient Hospital Stay (HOSPITAL_COMMUNITY): Admission: RE | Admit: 2013-12-19 | Payer: Medicare HMO | Source: Ambulatory Visit

## 2013-12-19 ENCOUNTER — Observation Stay (HOSPITAL_COMMUNITY): Payer: Medicare HMO

## 2013-12-19 LAB — AMMONIA: AMMONIA: 75 umol/L — AB (ref 11–60)

## 2013-12-19 LAB — BASIC METABOLIC PANEL
BUN: 37 mg/dL — ABNORMAL HIGH (ref 6–23)
CO2: 16 mEq/L — ABNORMAL LOW (ref 19–32)
Calcium: 8.8 mg/dL (ref 8.4–10.5)
Chloride: 111 mEq/L (ref 96–112)
Creatinine, Ser: 2.07 mg/dL — ABNORMAL HIGH (ref 0.50–1.35)
GFR calc Af Amer: 35 mL/min — ABNORMAL LOW (ref >90)
GFR, EST NON AFRICAN AMERICAN: 31 mL/min — AB (ref >90)
Glucose, Bld: 96 mg/dL (ref 70–99)
POTASSIUM: 4.4 meq/L (ref 3.7–5.3)
SODIUM: 141 meq/L (ref 137–147)

## 2013-12-19 LAB — GLUCOSE, CAPILLARY
GLUCOSE-CAPILLARY: 119 mg/dL — AB (ref 70–99)
GLUCOSE-CAPILLARY: 121 mg/dL — AB (ref 70–99)
Glucose-Capillary: 95 mg/dL (ref 70–99)
Glucose-Capillary: 152 mg/dL — ABNORMAL HIGH (ref 70–99)
Glucose-Capillary: 137 mg/dL — ABNORMAL HIGH (ref 70–99)
Glucose-Capillary: 134 mg/dL — ABNORMAL HIGH (ref 70–99)

## 2013-12-19 MED ORDER — RIFAXIMIN 550 MG PO TABS
550.0000 mg | ORAL_TABLET | Freq: Two times a day (BID) | ORAL | Status: DC
  Administered 2013-12-19 – 2013-12-20 (×3): 550 mg via ORAL
  Filled 2013-12-19 (×5): qty 1

## 2013-12-19 MED ORDER — ALBUMIN HUMAN 25 % IV SOLN
INTRAVENOUS | Status: AC
  Filled 2013-12-19: qty 100

## 2013-12-19 MED ORDER — SODIUM BICARBONATE 650 MG PO TABS
650.0000 mg | ORAL_TABLET | Freq: Two times a day (BID) | ORAL | Status: DC
Start: 2013-12-19 — End: 2013-12-21
  Administered 2013-12-19 – 2013-12-21 (×5): 650 mg via ORAL
  Filled 2013-12-19 (×5): qty 1

## 2013-12-19 MED ORDER — ALBUMIN HUMAN 25 % IV SOLN
25.0000 g | Freq: Once | INTRAVENOUS | Status: AC
  Administered 2013-12-19: 25 g via INTRAVENOUS
  Filled 2013-12-19: qty 100

## 2013-12-19 NOTE — Progress Notes (Signed)
UR chart review completed.  

## 2013-12-19 NOTE — Progress Notes (Signed)
Paracentesis complete no signs of distress. 5669ml peach colored abdominal fluid removed.

## 2013-12-19 NOTE — Procedures (Signed)
PreOperative Dx: Cirrhosis, ascites Postoperative Dx: Cirrhosis, ascites Procedure:   US guided paracentesis Radiologist:  Thornton Papas Anesthesia:  8 ml of 1% lidocaine Specimen:  5600 ml of peach-colored ascitic fluid EBL:   < 1 ml Complications: None

## 2013-12-19 NOTE — Progress Notes (Signed)
Nutrition Brief Note  Patient identified on the Malnutrition Screening Tool (MST) Report  Wt Readings from Last 15 Encounters:  12/18/13 152 lb 11.2 oz (69.264 kg)  12/15/13 156 lb 11.2 oz (71.079 kg)  12/02/13 150 lb 6.4 oz (68.221 kg)  11/24/13 153 lb 14.4 oz (69.809 kg)  11/22/13 150 lb 3.2 oz (68.13 kg)  11/14/13 158 lb (71.668 kg)  09/19/13 170 lb 4.8 oz (77.248 kg)  07/06/13 174 lb (78.926 kg)  07/06/13 174 lb (78.926 kg)  06/06/13 171 lb 8 oz (77.792 kg)  01/18/13 187 lb 11.2 oz (85.14 kg)  10/14/12 185 lb (83.915 kg)  07/22/12 188 lb 3.2 oz (85.367 kg)  01/22/12 194 lb 6.4 oz (88.179 kg)  10/16/11 198 lb (89.812 kg)   Chart reviewed. Pt admitted for AMS, cirrhosis, and hepatic encephalopathy. He is scheduled for a paracentesis today. Noted skin puncture to lower lt abdomen.  Appetite has been declining over the past several weeks. Wt hx reveals 18.7% wt loss x 1 year, 12.6% wt loss x 6 months (which is clinically significant), and 1.3% wt loss x 1 month. Ascites and paracentesis may be contributors for weight loss as well as PO intake.     MD initiated conversation of hospice with family and they are agreeable to receiving hospice at home at discharge.   Body mass index is 22.54 kg/(m^2). Patient meets criteria for normal weight based on current BMI.   Current diet order is carb modified, patient is consuming approximately 25% of meals at this time. Labs and medications reviewed.   No further nutrition interventions warranted at this time, due to transition to hospice care. Please re-consult RD as needed.  Amela Handley A. Jimmye Norman, RD, LDN Pager: [phone]

## 2013-12-19 NOTE — Progress Notes (Signed)
[facility] PROGRESS NOTE  Charles Hull JFH:[id number] DOB: July 01, 1941 DOA: 12/18/2013 PCP: Shanda Howells, MD  Assessment/Plan: 1. Hepatic encephalopathy, recurrent. Secondary to progressive liver disease and noncompliance with lactulose. MELD 23. Not a candidate for liver transplant or TIPS per GI. Some improvement this am. Continue lactulose. Await today's ammonia level.  Will add rifaximin. Has had 2 BM's since admission. monitor 2. 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 Continue Cipro day #2 for SBP prophylaxis. Scheduled for therapeutic paracentesis today. Will discuss Hospice care with wife as patient has indicated he is receptive to Hospice.  3. Pancytopenia, stable. Secondary to underlying liver disease. 4. Chronic kidney disease stage III, at baseline with associated metabolic acidosis.  5. Diabetes mellitus type 2. Diet controlled. CBG 95-119. Stable.  Code Status: DNR Family Communication: none present Disposition Plan: home hopefully tomorrow.   Consultants:  none  Procedures:  Paracentesis 12/19/12  Antibiotics:  cipro 12/19/23>>  HPI/Subjective: Awake alert and oriented. Denies pain/discomfort.   Objective: Filed Vitals:   12/19/13 0508  BP: 124/79  Pulse: 91  Temp: 98.6 F (37 C)  Resp: 18    Intake/Output Summary (Last 24 hours) at 12/19/13 0852 Last data filed at 12/18/13 1800  Gross per 24 hour  Intake      0 ml  Output    100 ml  Net   -100 ml   Filed Weights   12/18/13 1530  Weight: 69.264 kg (152 lb 11.2 oz)    Exam:   General:  Appears comfortable and alert  Cardiovascular: RRR, no m/g/r 3+LE bilaterally  Respiratory: normal effort BS somewhat distant but clear bilaterally no wheeze or rhonchi  Abdomen: very distended but soft +BS throughout. Non-tender to palpation  Musculoskeletal: no clubbing or cyanosis   Data Reviewed: Basic Metabolic Panel:  Recent Labs Lab 12/13/13 0615  12/14/13 0534 12/15/13 0540 12/18/13 1108 12/19/13 0511  NA 143 146 144 137 141  K 3.9 3.0* 3.8 4.7 4.4  CL 110 113* 115* 106 111  CO2 18* 18* 17* 15* 16*  GLUCOSE 127* 100* 109* 143* 96  BUN 43* 39* 33* 36* 37*  CREATININE 2.15* 2.05* 1.75* 2.01* 2.07*  CALCIUM 8.6 8.7 8.5 9.1 8.8  MG  --  2.3  --   --   --    Liver Function Tests:  Recent Labs Lab 12/12/13 1508 12/13/13 0615 12/18/13 1108  AST 34 35 41*  ALT 20 19 27   ALKPHOS 133* 127* 158*  BILITOT 2.2* 2.2* 2.4*  PROT 6.3 5.6* 6.4  ALBUMIN 3.1* 2.8* 3.5    Recent Labs Lab 12/12/13 1508  LIPASE 31    Recent Labs Lab 12/12/13 1511 12/13/13 0615 12/13/13 1956 12/18/13 1109  AMMONIA 89* 143* 45 113*   CBC:  Recent Labs Lab 12/12/13 1508 12/13/13 0615 12/14/13 0534 12/15/13 0540 12/18/13 1108  WBC 3.4* 2.9* 2.1* 2.6* 4.7  NEUTROABS 2.6  --   --   --  3.8  HGB 10.5* 9.7* 8.1* 8.9* 10.7*  HCT 30.7* 28.4* 23.9* 26.1* 31.2*  MCV 96.8 97.6 98.0 97.8 98.1  PLT 42* 39* 31* 34* 44*   Cardiac Enzymes:  Recent Labs Lab 12/12/13 1508  TROPONINI <0.30   BNP (last 3 results)  Recent Labs  12/12/13 1508  PROBNP 604.9*   CBG:  Recent Labs Lab 12/18/13 1108 12/18/13 1627 12/18/13 2037 12/19/13 0807  GLUCAP 135* 121* 119* 95    Recent Results (from the past 240 hour(s))  GRAM STAIN  Status: None   Collection Time    12/13/13  3:28 PM      Result Value Range Status   Specimen Description FLUID ASCITIC   Final   Special Requests NONE   Final   Gram Stain     Final   Value: CYTOSPIN NO WBC SEEN     NO ORGANISMS SEEN     Performed at [facility]   Report Status 12/14/2013 FINAL   Final  CULTURE, BODY FLUID-BOTTLE     Status: None   Collection Time    12/13/13  3:28 PM      Result Value Range Status   Specimen Description FLUID ASCITIC COLLECTED BY DOCTOR BOLES   Final   Special Requests BOTTLES DRAWN AEROBIC AND ANAEROBIC 10CC   Final   Culture NO GROWTH 5 DAYS   Final    Report Status 12/18/2013 FINAL   Final     Studies: Dg Chest Portable 1 View  12/18/2013   CLINICAL DATA:  Nausea and vomiting. Weakness and shortness of breath. Altered mental status.  EXAM: PORTABLE CHEST - 1 VIEW  COMPARISON:  DG ABD ACUTE W/CHEST dated 12/12/2013; US PARACENTESIS dated 12/13/2013  FINDINGS: Midline trachea. Cardiomegaly accentuated by AP portable technique. Small left-sided pleural effusion is again identified. Apparent lucency in the region of the left costophrenic angle is likely related to aerated lung adjacent to pleural fluid. No apical pneumothorax. No congestive failure. Persistent left base airspace disease.  IMPRESSION: Persistent small left pleural effusion with adjacent Airspace disease, likely atelectasis. Lucency in the region of the left costophrenic angle is favored to be related to aerated lung adjacent the pleural fluid. If there has been interval left thoracentesis, small volume pneumothorax would be difficult to exclude. PA and lateral radiographs may be informative.  Cardiomegaly without congestive failure.   Electronically Signed   By: Abigail Miyamoto M.D.   On: 12/18/2013 12:35    Scheduled Meds: . albumin human  25 g Intravenous Once  . allopurinol  100 mg Oral Daily  . ciprofloxacin  500 mg Oral Q breakfast  . furosemide  20 mg Oral Daily  . insulin aspart  0-9 Units Subcutaneous TID WC  . lactulose  10 g Oral TID  . megestrol  200 mg Oral BID  . pantoprazole  40 mg Oral Daily  . propranolol  10 mg Oral BID  . sodium bicarbonate  650 mg Oral BID  . sodium chloride  3 mL Intravenous Q12H   Continuous Infusions:   Principal Problem:   Hepatic encephalopathy Active Problems:   Cirrhosis of liver   CKD (chronic kidney disease) stage 3, GFR 30-59 ml/min   Pancytopenia   DM type 2 (diabetes mellitus, type 2)    Time spent: 30 minutes    [facility] Pager [phone]. If 7PM-7AM, please contact night-coverage at [email or web address],  password [facility] 12/19/2013, 8:52 AM  LOS: 1 day

## 2013-12-19 NOTE — Progress Notes (Signed)
Patient seen, independently examined and chart reviewed. I agree with exam, assessment and plan discussed with Dyanne Carrel, NP.  He feels better. 2 bowel movements overnight. Was able to use some breakfast. No nausea or vomiting. He feels very tired. Mild abdominal pain.  Afebrile, vital signs are stable. No hypoxia. He appears calm and comfortable. He is more alert today and oriented to self, month, year, location, president. Cardiovascular regular rate and rhythm. Respiratory clear to auscultation bilaterally. Abdomen is massively distended with ascites. 2-3+ bilateral lower extremity edema without significant change.  Ammonia level has improved, now 75. BUN/creatinine no significant change. Serum bicarbonate was secondary to loss from diarrhea.  Overall improving with decreased ammonia and improve mental status. Plan to continue aggressive lactulose and add Xifaxin. I discussed with Dr. Laural Golden this morning. There is no other treatment options available and hospice recommended. Discussed with the patient, he is open it is, discussed with wife as well. They would like to consider. If he continues to improve would anticipate discharge next 48 hours. Both patient and his wife want him back at home.  Murray Hodgkins, MD [facility] [phone]

## 2013-12-19 NOTE — Care Management Note (Addendum)
    Page 1 of 1   12/21/2013     1:17:14 PM   CARE MANAGEMENT NOTE 12/21/2013  Patient:  Charles Hull, Charles Hull   Account Number:  [id number]  Date Initiated:  12/19/2013  Documentation initiated by:  Theophilus Kinds  Subjective/Objective Assessment:   Pt admitted from home with hepatic encephalopathy. Pt lives with his wife and will return home at discharge. Pt requires maximal assistance with ADl's.     Action/Plan:   MD spoke with pt and his wife and is recommending Hospice. Family is in agreement and would like [facility] (per choice of agency). Referral sent to [facility] and list of DME needed in the home.   Anticipated DC Date:  12/21/2013   Anticipated DC Plan:  [place]  CM consult      Choice offered to / List presented to:             Status of service:  Completed, signed off Medicare Important Message given?  YES (If response is "NO", the following Medicare IM given date fields will be blank) Date Medicare IM given:  12/21/2013 Date Additional Medicare IM given:    Discharge Disposition:  [place]  Per UR Regulation:    If discussed at Long Length of Stay Meetings, dates discussed:    Comments:  12/21/13 [place], RN BSN CN Pt discharged home today with [facility] Hospice has arranged all DME. No other CM needs noted.  12/19/13 [place], RN BSN CM

## 2013-12-20 LAB — GLUCOSE, CAPILLARY
GLUCOSE-CAPILLARY: 114 mg/dL — AB (ref 70–99)
Glucose-Capillary: 106 mg/dL — ABNORMAL HIGH (ref 70–99)
Glucose-Capillary: 127 mg/dL — ABNORMAL HIGH (ref 70–99)
Glucose-Capillary: 119 mg/dL — ABNORMAL HIGH (ref 70–99)

## 2013-12-20 LAB — BASIC METABOLIC PANEL
BUN: 35 mg/dL — ABNORMAL HIGH (ref 6–23)
CHLORIDE: 111 meq/L (ref 96–112)
CO2: 18 mEq/L — ABNORMAL LOW (ref 19–32)
CREATININE: 2.09 mg/dL — AB (ref 0.50–1.35)
Calcium: 8.8 mg/dL (ref 8.4–10.5)
GFR calc non Af Amer: 30 mL/min — ABNORMAL LOW (ref >90)
GFR, EST AFRICAN AMERICAN: 35 mL/min — AB (ref >90)
Glucose, Bld: 100 mg/dL — ABNORMAL HIGH (ref 70–99)
POTASSIUM: 4.6 meq/L (ref 3.7–5.3)
SODIUM: 142 meq/L (ref 137–147)

## 2013-12-20 LAB — AMMONIA: Ammonia: 149 umol/L — ABNORMAL HIGH (ref 11–60)

## 2013-12-20 MED ORDER — ZOLPIDEM TARTRATE 5 MG PO TABS
5.0000 mg | ORAL_TABLET | Freq: Every evening | ORAL | Status: DC | PRN
  Administered 2013-12-20: 5 mg via ORAL
  Filled 2013-12-20: qty 1

## 2013-12-20 MED ORDER — LACTULOSE 10 GM/15ML PO SOLN
30.0000 g | ORAL | Status: AC
  Filled 2013-12-20 (×3): qty 60

## 2013-12-20 MED ORDER — LACTULOSE 10 GM/15ML PO SOLN
30.0000 g | ORAL | Status: AC
  Administered 2013-12-20 (×4): 30 g via ORAL
  Filled 2013-12-20 (×3): qty 60

## 2013-12-20 MED ORDER — LACTULOSE 10 GM/15ML PO SOLN: 30.0000 g | ORAL | Status: DC

## 2013-12-20 NOTE — Progress Notes (Signed)
[facility] PROGRESS NOTE  Charles Hull [id number]:[id number] DOB: 12/06/1940 DOA: 12/18/2013 PCP: Shanda Howells, MD  Assessment/Plan: 1. Hepatic encephalopathy, recurrent. Secondary to progressive liver disease and noncompliance with lactulose. MELD 23. Not a candidate for liver transplant or TIPS per GI. More somnolent this am. Ammonia level increased to 149 for 75 yesterday. Lactulose increased to every 2 hours. Xifaxan discontinued.  No BM's yet today but has been delay in lactulose administration. Will monitor closely 2. 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 Continue Cipro day #3 for SBP prophylaxis. Therapeutic paracentesis on 12/19/13 with removal of 5.6L. Patient and wife receptive to  Hospice care. Arrangements being made.   3. Pancytopenia, stable. Secondary to underlying liver disease. 4. Chronic kidney disease stage III, at baseline with associated metabolic acidosis.  5. Diabetes mellitus type 2. Diet controlled. CBG 106-114. Stable.   Code Status: DNR Family Communication:  Disposition Plan: hopefully tomorrow   Consultants:  none  Procedures:  Paracentesis 12/18/13>>>  Antibiotics:  cipro 12/18/13>>>  HPI/Subjective: Somewhat somnolent. Denies pain/discomfort.   Objective: Filed Vitals:   12/20/13 0538  BP: 125/84  Pulse: 94  Temp: 98.6 F (37 C)  Resp: 18    Intake/Output Summary (Last 24 hours) at 12/20/13 1149 Last data filed at 12/20/13 1015  Gross per 24 hour  Intake    720 ml  Output      0 ml  Net    720 ml   Filed Weights   12/18/13 1530 12/20/13 0500  Weight: 69.264 kg (152 lb 11.2 oz) 66.1 kg (145 lb 11.6 oz)    Exam:   General:  Ill appearing but NAD  Cardiovascular: RRR no MGR 2+LE edema  Respiratory: normal effort BS clear bilaterally to ausculation. No wheeze or rhonchi  Abdomen: distended but less so than yesterday, +BS non-tender to palpation  Musculoskeletal: no clubbing or cyanosis    Data Reviewed: Basic Metabolic Panel:  Recent Labs Lab 12/14/13 0534 12/15/13 0540 12/18/13 1108 12/19/13 0511 12/20/13 0446  NA 146 144 137 141 142  K 3.0* 3.8 4.7 4.4 4.6  CL 113* 115* 106 111 111  CO2 18* 17* 15* 16* 18*  GLUCOSE 100* 109* 143* 96 100*  BUN 39* 33* 36* 37* 35*  CREATININE 2.05* 1.75* 2.01* 2.07* 2.09*  CALCIUM 8.7 8.5 9.1 8.8 8.8  MG 2.3  --   --   --   --    Liver Function Tests:  Recent Labs Lab 12/18/13 1108  AST 41*  ALT 27  ALKPHOS 158*  BILITOT 2.4*  PROT 6.4  ALBUMIN 3.5   No results found for this basename: LIPASE, AMYLASE,  in the last 168 hours  Recent Labs Lab 12/13/13 1956 12/18/13 1109 12/19/13 0825 12/20/13 0446  AMMONIA 45 113* 75* 149*   CBC:  Recent Labs Lab 12/14/13 0534 12/15/13 0540 12/18/13 1108  WBC 2.1* 2.6* 4.7  NEUTROABS  --   --  3.8  HGB 8.1* 8.9* 10.7*  HCT 23.9* 26.1* 31.2*  MCV 98.0 97.8 98.1  PLT 31* 34* 44*   Cardiac Enzymes: No results found for this basename: CKTOTAL, CKMB, CKMBINDEX, TROPONINI,  in the last 168 hours BNP (last 3 results)  Recent Labs  12/12/13 1508  PROBNP 604.9*   CBG:  Recent Labs Lab 12/19/13 1241 12/19/13 1620 12/19/13 2056 12/20/13 0801 12/20/13 1123  GLUCAP 137* 134* 121* 106* 114*    Recent Results (from the past 240 hour(s))  GRAM STAIN  Status: None   Collection Time    12/13/13  3:28 PM      Result Value Range Status   Specimen Description FLUID ASCITIC   Final   Special Requests NONE   Final   Gram Stain     Final   Value: CYTOSPIN NO WBC SEEN     NO ORGANISMS SEEN     Performed at [facility]   Report Status 12/14/2013 FINAL   Final  CULTURE, BODY FLUID-BOTTLE     Status: None   Collection Time    12/13/13  3:28 PM      Result Value Range Status   Specimen Description FLUID ASCITIC COLLECTED BY DOCTOR BOLES   Final   Special Requests BOTTLES DRAWN AEROBIC AND ANAEROBIC 10CC   Final   Culture NO GROWTH 5 DAYS   Final    Report Status 12/18/2013 FINAL   Final     Studies: US Paracentesis  12/19/2013   CLINICAL DATA:  Cirrhosis, ascites  EXAM: ULTRASOUND GUIDED THERAPEUTIC PARACENTESIS  COMPARISON:  12/13/2013  PROCEDURE: Procedure, benefits, and risks of procedure were discussed with patient.  Written informed consent for procedure was obtained.  Time out protocol followed.  Adequate collection of ascites localized in right lower quadrant.  Skin prepped and draped in usual sterile fashion.  Skin and soft tissues anesthetized with 8 mL of 1% lidocaine.  5 [place] Yueh catheter placed into peritoneal cavity.  5600 mL of speech colored fluid aspirated by vacuum bottle suction.  Procedure tolerated well by patient without immediate complication.  FINDINGS: As above  IMPRESSION: Successful ultrasound guided paracentesis yielding 5600 mL of ascites.   Electronically Signed   By: Lavonia Dana M.D.   On: 12/19/2013 17:12   Dg Chest Portable 1 View  12/18/2013   CLINICAL DATA:  Nausea and vomiting. Weakness and shortness of breath. Altered mental status.  EXAM: PORTABLE CHEST - 1 VIEW  COMPARISON:  DG ABD ACUTE W/CHEST dated 12/12/2013; US PARACENTESIS dated 12/13/2013  FINDINGS: Midline trachea. Cardiomegaly accentuated by AP portable technique. Small left-sided pleural effusion is again identified. Apparent lucency in the region of the left costophrenic angle is likely related to aerated lung adjacent to pleural fluid. No apical pneumothorax. No congestive failure. Persistent left base airspace disease.  IMPRESSION: Persistent small left pleural effusion with adjacent Airspace disease, likely atelectasis. Lucency in the region of the left costophrenic angle is favored to be related to aerated lung adjacent the pleural fluid. If there has been interval left thoracentesis, small volume pneumothorax would be difficult to exclude. PA and lateral radiographs may be informative.  Cardiomegaly without congestive failure.   Electronically Signed    By: Abigail Miyamoto M.D.   On: 12/18/2013 12:35    Scheduled Meds: . allopurinol  100 mg Oral Daily  . ciprofloxacin  500 mg Oral Q breakfast  . furosemide  20 mg Oral Daily  . insulin aspart  0-9 Units Subcutaneous TID WC  . lactulose  30 g Oral Q2H  . megestrol  200 mg Oral BID  . pantoprazole  40 mg Oral Daily  . propranolol  10 mg Oral BID  . sodium bicarbonate  650 mg Oral BID  . sodium chloride  3 mL Intravenous Q12H   Continuous Infusions:   Principal Problem:   Hepatic encephalopathy Active Problems:   Cirrhosis of liver   CKD (chronic kidney disease) stage 3, GFR 30-59 ml/min   Pancytopenia   DM type 2 (diabetes mellitus, type  2)    Time spent: [facility] Pager [phone]. If 7PM-7AM, please contact night-coverage at [email or web address], password [facility] 12/20/2013, 11:49 AM  LOS: 2 days

## 2013-12-20 NOTE — Progress Notes (Signed)
Patient seen, independently examined and chart reviewed. I agree with exam, assessment and plan discussed with Dyanne Carrel, NP.  Feels okay today. No pain. Appetite fair. Now has had 2 bowel movements.  Appears calm and comfortable. Speech fluent and clear. Afebrile, vital signs are stable. Minimal confusion. Cardiovascular regular rate and rhythm. Respiratory clear to auscultation bilaterally. Abdomen soft nontender.  Blood sugars are stable. Creatinine is stable. Ammonia level now 149.  Although his ammonia level has increased significantly he remains alert and responsive. He is not responding to more aggressive dosing of lactulose 2 bowel movements today. Plan to continue aggressive lactulose to achieve several bowel movements today, repeat ammonia level in the morning. If improved can likely discharge home 2/11 with home hospice. FMLA form was completed for son. He does not like Megace in liquid form, requests pill.  Murray Hodgkins, MD [facility] [phone]

## 2013-12-21 ENCOUNTER — Ambulatory Visit (HOSPITAL_COMMUNITY): Payer: Medicare HMO

## 2013-12-21 DIAGNOSIS — D649 Anemia, unspecified: Secondary | ICD-10-CM

## 2013-12-21 DIAGNOSIS — E119 Type 2 diabetes mellitus without complications: Secondary | ICD-10-CM

## 2013-12-21 DIAGNOSIS — D696 Thrombocytopenia, unspecified: Secondary | ICD-10-CM

## 2013-12-21 DIAGNOSIS — N183 Chronic kidney disease, stage 3 unspecified: Secondary | ICD-10-CM

## 2013-12-21 DIAGNOSIS — E860 Dehydration: Secondary | ICD-10-CM

## 2013-12-21 DIAGNOSIS — E785 Hyperlipidemia, unspecified: Secondary | ICD-10-CM

## 2013-12-21 DIAGNOSIS — I10 Essential (primary) hypertension: Secondary | ICD-10-CM

## 2013-12-21 LAB — AMMONIA: Ammonia: 66 umol/L — ABNORMAL HIGH (ref 11–60)

## 2013-12-21 LAB — BASIC METABOLIC PANEL
BUN: 36 mg/dL — AB (ref 6–23)
CALCIUM: 9 mg/dL (ref 8.4–10.5)
CHLORIDE: 114 meq/L — AB (ref 96–112)
CO2: 17 meq/L — AB (ref 19–32)
Creatinine, Ser: 2.02 mg/dL — ABNORMAL HIGH (ref 0.50–1.35)
GFR calc Af Amer: 36 mL/min — ABNORMAL LOW (ref >90)
GFR calc non Af Amer: 31 mL/min — ABNORMAL LOW (ref >90)
GLUCOSE: 98 mg/dL (ref 70–99)
Potassium: 3.8 mEq/L (ref 3.7–5.3)
Sodium: 146 mEq/L (ref 137–147)

## 2013-12-21 LAB — GLUCOSE, CAPILLARY
GLUCOSE-CAPILLARY: 132 mg/dL — AB (ref 70–99)
Glucose-Capillary: 101 mg/dL — ABNORMAL HIGH (ref 70–99)

## 2013-12-21 MED ORDER — ZOLPIDEM TARTRATE 5 MG PO TABS: 5.0000 mg | ORAL_TABLET | Freq: Every evening | ORAL | Status: DC | PRN

## 2013-12-21 MED ORDER — FUROSEMIDE 20 MG PO TABS: 20.0000 mg | ORAL_TABLET | Freq: Every day | ORAL | Status: DC

## 2013-12-21 MED ORDER — SODIUM BICARBONATE 650 MG PO TABS: 650.0000 mg | ORAL_TABLET | Freq: Two times a day (BID) | ORAL | Status: AC

## 2013-12-21 NOTE — Progress Notes (Signed)
UR chart review completed.  

## 2013-12-21 NOTE — Progress Notes (Signed)
Discharge instructions given on medications,and follow visits,patient ,and family verbalized understanding.Prescriptions sent with patient, stress the importance of taken medications as directed. No c/o pain or discomfort noted. Accompanied by staff to an awaiting vehicle.

## 2013-12-21 NOTE — Discharge Summary (Signed)
Physician Discharge Summary  Charles Hull [id number] DOB: Sep 13, 1941 DOA: 12/18/2013  PCP: Shanda Howells, MD  Admit date: 12/18/2013 Discharge date: 12/21/2013  Time spent: 45 minutes  Recommendations for Outpatient Follow-up:  1. Being discharged with Hospice care of [facility] to manage pain and coordinate comfort care 2. Weekly paracentesis with GI  Discharge Diagnoses:  Principal Problem:   Hepatic encephalopathy Active Problems:   Cirrhosis of liver   CKD (chronic kidney disease) stage 3, GFR 30-59 ml/min   Pancytopenia   DM type 2 (diabetes mellitus, type 2)   Discharge Condition: fair  Diet recommendation: carb modified  Filed Weights   12/18/13 1530 12/20/13 0500  Weight: 69.264 kg (152 lb 11.2 oz) 66.1 kg (145 lb 11.6 oz)    History of present illness:  73 year old man with history of cirrhosis secondary to fatty liver disease, recurrent ascites and hepatic encephalopathy just recently discharged for the same presented 12/18/13 with increased confusion. Found to have hyperammonemia and admitted for presumed hepatic encephalopathy.  History obtained from wife at bedside as the patient is confused. She reported he was compliant with lactulose at home since discharge 2/5, twice a day, except that he did not take any yesterday his family was at home. Despite lactulose he has not had a bowel movement for the previous several days. On 12/18/13 he was more confused and she gave him several doses of lactulose and he had already had several bowel movements. Besides being confused he was otherwise had no other symptoms except increased abdominal girth and lower extremity edema.  In the emergency department afebrile, vital signs stable. Creatinine at baseline, 2.01. Elevated LFTs without significant change. Pancytopenia stable. Chest x-ray with small left pleural effusion, compared to previous studies no significant change. He had not had a recent thoracentesis. EKG independently  reviewed showed sinus tachycardia, no acute changes.   Hospital Course:  1. Hepatic encephalopathy, recurrent. Secondary to progressive liver disease and noncompliance with lactulose. MELD 23. Not a candidate for liver transplant or TIPS per GI. Ammonia level 66 on day of discharge. He required aggressive dosing of Lactulose yesterday. Have been given instructions to continue lactulose and give extra doses until at least 3 BM's daily. Xifaxan discontinued. Will be discharged with [facility] of [facility] for comfort care.  2. 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 Recieved Cipro for 4 days for SBP prophylaxis. Therapeutic paracentesis on 12/19/13 with removal of 5.6L. Patient and wife receptive to Hospice care. Arrangements being made.  3. Pancytopenia, stable. Secondary to underlying liver disease. 4. Chronic kidney disease stage III, at baseline with associated metabolic acidosis.  5. Diabetes mellitus type 2. Diet controlled. CBG 106-114. Stable.   Procedures:  Paracentesis 12/19/13>>  Consultations:  none  Discharge Exam: Filed Vitals:   12/21/13 0624  BP: 125/60  Pulse: 99  Temp: 98.5 F (36.9 C)  Resp: 18    General: alert oriented NAD Cardiovascular: RRR No MGR 2+LE edema Respiratory: normal effort BS clear bilaterally no wheeze Abdomen: distended but less than 2 days ago. +BS tolerating diet  Discharge Instructions  Discharge Orders   Future Appointments Provider Department Dept Phone   02/06/2014 3:30 PM Rogene Houston, MD [place] [phone]   06/01/2014 9:00 AM [facility] [place] [phone]   Eat a light meal the night before the exam Nothing to eat or drink for at least 8 hours before exam No gum chewing, or smoking the  morning of the exam. Please take your morning medications with small sips of water, especially blood pressure medication *Very Important* Please  wear 2 piece clothing   06/01/2014 9:30 AM [facility] [place] [address] [id number]   06/01/2014 10:30 AM Elam Dutch, MD [facility] [phone]   Future Orders Complete By Expires   Diet - low sodium heart healthy  As directed    Discharge instructions  As directed    Comments:     Take medications as directed Take lactulose for at least 3 BMs daily   Increase activity slowly  As directed        Medication List    STOP taking these medications       ciprofloxacin 500 MG tablet  Commonly known as:  CIPRO     D3-1000 1000 UNITS capsule  Generic drug:  Cholecalciferol     doxazosin 4 MG tablet  Commonly known as:  CARDURA     NON FORMULARY      TAKE these medications       allopurinol 100 MG tablet  Commonly known as:  ZYLOPRIM  Take 100 mg by mouth daily.     furosemide 20 MG tablet  Commonly known as:  LASIX  Take 1 tablet (20 mg total) by mouth daily.     lactulose 10 GM/15ML solution  Commonly known as:  CHRONULAC  Take 15 mLs (10 g total) by mouth 2 (two) times daily.     megestrol 400 MG/10ML suspension  Commonly known as:  MEGACE  Take 200 mg by mouth 2 (two) times daily.     pantoprazole 40 MG tablet  Commonly known as:  PROTONIX  Take 40 mg by mouth daily.     propranolol 10 MG tablet  Commonly known as:  INDERAL  Take 1 tablet (10 mg total) by mouth 2 (two) times daily.     sodium bicarbonate 650 MG tablet  Take 1 tablet (650 mg total) by mouth 2 (two) times daily.     zolpidem 5 MG tablet  Commonly known as:  AMBIEN  Take 1 tablet (5 mg total) by mouth at bedtime as needed for sleep.       Allergies  Allergen Reactions  . Lipitor [Atorvastatin Calcium]       The results of significant diagnostics from this hospitalization (including imaging, microbiology, ancillary and laboratory) are listed below for reference.    Significant Diagnostic Studies: Ct Abdomen Pelvis Wo  Contrast  11/24/2013   CLINICAL DATA:  Abdominal aortic aneurysm. History of multiple recent paracenteses. Cirrhosis. Ascites. Diarrhea.  EXAM: CT ABDOMEN AND PELVIS WITHOUT CONTRAST  TECHNIQUE: Multidetector CT imaging of the abdomen and pelvis was performed following the standard protocol without intravenous contrast.  COMPARISON:  CT of the abdomen and pelvis 01/31/2013.  FINDINGS: Lung Bases: Small left pleural effusion with some dependent subsegmental atelectasis in the left lower lobe. Trace amount of pericardial fluid and/or thickening. No associated pericardial calcification.  Abdomen/Pelvis: The liver has a markedly shrunken appearance and nodular contour, compatible with advanced cirrhosis. Small calcification in the right lobe of the liver likely a granuloma, as are small calcifications within the spleen. 1.4 x 1.0 cm low-attenuation lesion in the right lobe of the liver is incompletely characterized, but similar to prior examinations. Status post cholecystectomy. The unenhanced appearance of the pancreas and bilateral adrenal glands is unremarkable. Status post left nephrectomy. 1.4 cm low-attenuation lesion in the lateral aspect of the  interpolar region of the right kidney is unchanged compared to prior studies, and although incompletely characterized on today's non contrast CT examination, likely to represent a small cyst.  Large volume of ascites. No pneumoperitoneum. No pathologic distention of small bowel. Diffuse mesenteric edema. Numerous serpiginous densities throughout the upper abdomen likely to represent multiple portosystemic collateral vessels and varices. Extensive atherosclerosis of the abdominal and pelvic vasculature, including bilobed fusiform infrarenal abdominal aortic aneurysm which measures up to 4.7 x 4.8 cm proximally, and 3.9 x 3.7 cm distally.  Musculoskeletal: There are no aggressive appearing lytic or blastic lesions noted in the visualized portions of the skeleton.   IMPRESSION: 1. Enlarging bilobed fusiform infrarenal abdominal aortic aneurysm which measures up to 4.8 x 4.7 cm. 2. Advanced cirrhosis with large volume of ascites. 3. Multiple serpiginous densities throughout the upper abdomen, likely related to portosystemic collateral vessels and numerous varices. 4. New moderate left pleural effusion with passive atelectasis in the left lower lobe. 5. Trace volume of pericardial fluid and/or thickening. 6. Status post left nephrectomy. 7. Additional incidental findings, similar prior studies, as above.   Electronically Signed   By: Trudie Reed M.D.   On: 11/24/2013 14:25   Ct Head Wo Contrast  12/12/2013   CLINICAL DATA:  Altered mental status  EXAM: CT HEAD WITHOUT CONTRAST  TECHNIQUE: Contiguous axial images were obtained from the base of the skull through the vertex without intravenous contrast. Study was obtained within 24 hr of patient's arrival at the emergency department.  COMPARISON:  June 07, 2010  FINDINGS: Ventricles are normal in size and configuration. There is no mass, hemorrhage, extra-axial fluid collection, or midline shift. Gray-white compartments appear within normal limits. There is no demonstrable acute infarct.  Bony calvarium appears intact. There is diffuse opacification of mastoids on the right, a stable finding. There is mucosal thickening in the left maxillary antrum with a retention cyst inferiorly.  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 Left maxillary sinus disease, not present on prior study.  No intracranial lesion appreciated. In particular, no intracranial mass, hemorrhage, or acute appearing infarct.   Electronically Signed   By: Bretta Bang M.D.   On: 12/12/2013 16:30   US Paracentesis  12/19/2013   CLINICAL DATA:  Cirrhosis, ascites  EXAM: ULTRASOUND GUIDED THERAPEUTIC PARACENTESIS  COMPARISON:  12/13/2013  PROCEDURE: Procedure, benefits, and risks of procedure were discussed with patient.  Written informed consent  for procedure was obtained.  Time out protocol followed.  Adequate collection of ascites localized in right lower quadrant.  Skin prepped and draped in usual sterile fashion.  Skin and soft tissues anesthetized with 8 mL of 1% lidocaine.  5 [place] Yueh catheter placed into peritoneal cavity.  5600 mL of speech colored fluid aspirated by vacuum bottle suction.  Procedure tolerated well by patient without immediate complication.  FINDINGS: As above  IMPRESSION: Successful ultrasound guided paracentesis yielding 5600 mL of ascites.   Electronically Signed   By: Ulyses Southward M.D.   On: 12/19/2013 17:12   US Paracentesis  12/13/2013   CLINICAL DATA:  Cirrhosis, ascites  EXAM: ULTRASOUND GUIDED THERAPEUTIC PARACENTESIS  COMPARISON:  12/01/2013  PROCEDURE: Procedure, benefits, and risks of procedure were discussed with patient.  Written informed consent for procedure was obtained.  Time out protocol followed.  Adequate collection of ascites localized in right lower quadrant.  Skin prepped and draped in usual sterile fashion.  Skin and soft tissues anesthetized with 10 mL of 1% lidocaine.  5 [place]  catheter placed into peritoneal cavity.  4000 mL of peach-colored fluid aspirated by vacuum bottle suction.  Procedure tolerated well by patient without immediate complication.  FINDINGS: A total of approximately 4000 mL of peach-colored fluid was removed. A fluid sample was sent for laboratory analysis.  IMPRESSION: Successful ultrasound guided paracentesis yielding 4000 mL of ascites.   Electronically Signed   By: Lavonia Dana M.D.   On: 12/13/2013 18:21   US Paracentesis  12/01/2013   CLINICAL DATA:  Cirrhosis.  EXAM: ULTRASOUND GUIDED left-sided PARACENTESIS  COMPARISON:  None.  PROCEDURE: An ultrasound guided paracentesis was thoroughly discussed with the patient and questions answered. The benefits, risks, alternatives and complications were also discussed. The patient understands and wishes to proceed with the  procedure. Written consent was obtained.  Ultrasound was performed to localize and mark an adequate pocket of fluid in the left lower quadrant of the abdomen. The area was then prepped and draped in the normal sterile fashion. 1% Lidocaine was used for local anesthesia. Under ultrasound guidance a 19 gauge Yueh catheter was introduced. Paracentesis was performed. The catheter was removed and a dressing applied.  Complications: The fluid was blood tinged. I discussed this with Dr. Laural Golden  FINDINGS: A total of approximately 5.3 L of blood tinged ascitic fluid was removed. A fluid sample was sent for laboratory analysis.  IMPRESSION: Successful ultrasound guided paracentesis yielding 5.3 L of ascites.   Electronically Signed   By: Kalman Jewels M.D.   On: 12/01/2013 15:40   US Paracentesis  11/22/2013   CLINICAL DATA:  Cirrhosis, ascites  EXAM: ULTRASOUND GUIDED PARACENTESIS  COMPARISON:  11/07/2013  PROCEDURE: Procedure, benefits, and risks of procedure were discussed with patient.  Written informed consent for procedure was obtained.  Time out protocol followed.  Adequate collection of ascites localized in lateral left abdomen.  Skin prepped and draped in usual sterile fashion.  Skin and soft tissues anesthetized with 9 mL of 1%.  5 [place] Yueh catheter placed into peritoneal cavity.  5600 mL of clear yellow fluid aspirated by vacuum bottle suction.  Procedure tolerated well by patient without immediate complication.  IMPRESSION: Successful ultrasound guided paracentesis yielding 5600 mL of ascites.  : A total of approximately 5600 mL of clear yellow ascitic fluid was removed. A fluid sample of 180 mL was sent for laboratory analysis.   Electronically Signed   By: Lavonia Dana M.D.   On: 11/22/2013 17:18   Dg Chest Portable 1 View  12/18/2013   CLINICAL DATA:  Nausea and vomiting. Weakness and shortness of breath. Altered mental status.  EXAM: PORTABLE CHEST - 1 VIEW  COMPARISON:  DG ABD ACUTE W/CHEST dated  12/12/2013; US PARACENTESIS dated 12/13/2013  FINDINGS: Midline trachea. Cardiomegaly accentuated by AP portable technique. Small left-sided pleural effusion is again identified. Apparent lucency in the region of the left costophrenic angle is likely related to aerated lung adjacent to pleural fluid. No apical pneumothorax. No congestive failure. Persistent left base airspace disease.  IMPRESSION: Persistent small left pleural effusion with adjacent Airspace disease, likely atelectasis. Lucency in the region of the left costophrenic angle is favored to be related to aerated lung adjacent the pleural fluid. If there has been interval left thoracentesis, small volume pneumothorax would be difficult to exclude. PA and lateral radiographs may be informative.  Cardiomegaly without congestive failure.   Electronically Signed   By: Abigail Miyamoto M.D.   On: 12/18/2013 12:35   Dg Abd Acute W/chest  12/12/2013   CLINICAL  DATA:  Weakness  EXAM: ACUTE ABDOMEN SERIES (ABDOMEN 2 VIEW & CHEST 1 VIEW)  COMPARISON:  11/29/2013  FINDINGS: Cardiac shadow is stable. Persistent left-sided effusion and underlying atelectasis is noted and slightly increased. The right lung remains clear. Scattered large and small bowel gas is noted. No free air is noted. Postsurgical changes are seen.  IMPRESSION: Increasing left-sided effusion and atelectasis.   Electronically Signed   By: Inez Catalina M.D.   On: 12/12/2013 16:19   Dg Abd Acute W/chest  11/29/2013   CLINICAL DATA:  Generalized weakness  EXAM: ACUTE ABDOMEN SERIES (ABDOMEN 2 VIEW & CHEST 1 VIEW)  COMPARISON:  11/24/2013  FINDINGS: Small left effusion persist with left base collapse/consolidation. Right lung remains clear. Normal heart size and vascularity. Negative for pneumothorax. No gross free air but limited exam because of positioning. Scattered air and stool throughout the bowel. No significant obstruction pattern. Postop changes noted diffusely. Vascular calcifications evident.   IMPRESSION: Small left effusion with left base atelectasis/ consolidation. Pneumonia not excluded.  No free air  Negative for obstruction  Stable chronic postoperative findings   Electronically Signed   By: Daryll Brod M.D.   On: 11/29/2013 21:10    Microbiology: Recent Results (from the past 240 hour(s))  GRAM STAIN     Status: None   Collection Time    12/13/13  3:28 PM      Result Value Ref Range Status   Specimen Description FLUID ASCITIC   Final   Special Requests NONE   Final   Gram Stain     Final   Value: CYTOSPIN NO WBC SEEN     NO ORGANISMS SEEN     Performed at [facility]   Report Status 12/14/2013 FINAL   Final  CULTURE, BODY FLUID-BOTTLE     Status: None   Collection Time    12/13/13  3:28 PM      Result Value Ref Range Status   Specimen Description FLUID ASCITIC COLLECTED BY DOCTOR BOLES   Final   Special Requests BOTTLES DRAWN AEROBIC AND ANAEROBIC 10CC   Final   Culture NO GROWTH 5 DAYS   Final   Report Status 12/18/2013 FINAL   Final     Labs: Basic Metabolic Panel:  Recent Labs Lab 12/15/13 0540 12/18/13 1108 12/19/13 0511 12/20/13 0446 12/21/13 0454  NA 144 137 141 142 146  K 3.8 4.7 4.4 4.6 3.8  CL 115* 106 111 111 114*  CO2 17* 15* 16* 18* 17*  GLUCOSE 109* 143* 96 100* 98  BUN 33* 36* 37* 35* 36*  CREATININE 1.75* 2.01* 2.07* 2.09* 2.02*  CALCIUM 8.5 9.1 8.8 8.8 9.0   Liver Function Tests:  Recent Labs Lab 12/18/13 1108  AST 41*  ALT 27  ALKPHOS 158*  BILITOT 2.4*  PROT 6.4  ALBUMIN 3.5   No results found for this basename: LIPASE, AMYLASE,  in the last 168 hours  Recent Labs Lab 12/18/13 1109 12/19/13 0825 12/20/13 0446 12/21/13 0454  AMMONIA 113* 75* 149* 66*   CBC:  Recent Labs Lab 12/15/13 0540 12/18/13 1108  WBC 2.6* 4.7  NEUTROABS  --  3.8  HGB 8.9* 10.7*  HCT 26.1* 31.2*  MCV 97.8 98.1  PLT 34* 44*   Cardiac Enzymes: No results found for this basename: CKTOTAL, CKMB, CKMBINDEX, TROPONINI,  in the  last 168 hours BNP: BNP (last 3 results)  Recent Labs  12/12/13 1508  PROBNP 604.9*   CBG:  Recent Labs Lab  12/20/13 0801 12/20/13 1123 12/20/13 1654 12/20/13 2120 12/21/13 0724  GLUCAP 106* 114* 127* 119* 101*       Signed:  BLACK,KAREN M  [facility] 12/21/2013, 9:47 AM

## 2013-12-22 ENCOUNTER — Telehealth (INDEPENDENT_AMBULATORY_CARE_PROVIDER_SITE_OTHER): Payer: Self-pay | Admitting: *Deleted

## 2013-12-22 NOTE — Telephone Encounter (Signed)
Charles Hull called to let us know that Charles Hull was discharged from [facility] to Charles Hull to his home yesterday -- hospice nurse came out today to assess him and they were told that hospice would only allow Charles Hull to have 1 maybe 2 more taps then they wouldn't allow him to have anymore as they would discharge him from their services-- Charles Charles Hull doesn't agree with this and wants to talk to you   Charles Hull will call hospice to get more info

## 2013-12-26 ENCOUNTER — Other Ambulatory Visit (INDEPENDENT_AMBULATORY_CARE_PROVIDER_SITE_OTHER): Payer: Self-pay | Admitting: Internal Medicine

## 2013-12-26 ENCOUNTER — Ambulatory Visit (HOSPITAL_COMMUNITY)
Admission: RE | Admit: 2013-12-26 | Discharge: 2013-12-26 | Disposition: A | Discharge status: Home / Self Care | Payer: Medicare HMO | Source: Ambulatory Visit | Attending: Internal Medicine | Admitting: Internal Medicine

## 2013-12-26 ENCOUNTER — Other Ambulatory Visit (INDEPENDENT_AMBULATORY_CARE_PROVIDER_SITE_OTHER): Payer: Self-pay | Admitting: *Deleted

## 2013-12-26 ENCOUNTER — Encounter (HOSPITAL_COMMUNITY): Payer: Self-pay

## 2013-12-26 DIAGNOSIS — R188 Other ascites: Secondary | ICD-10-CM | POA: Insufficient documentation

## 2013-12-26 DIAGNOSIS — K7460 Unspecified cirrhosis of liver: Secondary | ICD-10-CM | POA: Insufficient documentation

## 2013-12-26 NOTE — Progress Notes (Signed)
Paracentesis complete no signs of distress. 7000 ml brown abdominal fluid removed.

## 2013-12-26 NOTE — Procedures (Signed)
PreOperative Dx: Cirrhosis, ascites Postoperative Dx: Cirrhosis, ascites Procedure:   US guided paracentesis Radiologist:  Thornton Papas Anesthesia:  10 ml of 1% lidocaine Specimen:  7000 ml of yellow ascitic fluid EBL:   None Complications: None

## 2013-12-26 NOTE — Telephone Encounter (Signed)
Talked with patient's wife; She was unhappy with hospice recommendations and would not they're not get them involved. Will arrange for patient to undergo a tap tomorrow.

## 2013-12-27 ENCOUNTER — Other Ambulatory Visit (HOSPITAL_COMMUNITY): Payer: Medicare HMO

## 2014-01-02 ENCOUNTER — Encounter (HOSPITAL_COMMUNITY): Payer: Self-pay | Admitting: Emergency Medicine

## 2014-01-02 ENCOUNTER — Inpatient Hospital Stay (HOSPITAL_COMMUNITY)
Admission: EM | Admit: 2014-01-02 | Discharge: 2014-01-04 | DRG: 442 | Disposition: A | Discharge status: Hospice | Payer: Medicare HMO | Attending: Internal Medicine | Admitting: Internal Medicine

## 2014-01-02 DIAGNOSIS — E872 Acidosis, unspecified: Secondary | ICD-10-CM | POA: Diagnosis present

## 2014-01-02 DIAGNOSIS — E785 Hyperlipidemia, unspecified: Secondary | ICD-10-CM

## 2014-01-02 DIAGNOSIS — K7689 Other specified diseases of liver: Secondary | ICD-10-CM | POA: Diagnosis present

## 2014-01-02 DIAGNOSIS — Z8249 Family history of ischemic heart disease and other diseases of the circulatory system: Secondary | ICD-10-CM

## 2014-01-02 DIAGNOSIS — E7220 Disorder of urea cycle metabolism, unspecified: Secondary | ICD-10-CM | POA: Diagnosis present

## 2014-01-02 DIAGNOSIS — I6529 Occlusion and stenosis of unspecified carotid artery: Secondary | ICD-10-CM

## 2014-01-02 DIAGNOSIS — I714 Abdominal aortic aneurysm, without rupture, unspecified: Secondary | ICD-10-CM

## 2014-01-02 DIAGNOSIS — N183 Chronic kidney disease, stage 3 unspecified: Secondary | ICD-10-CM

## 2014-01-02 DIAGNOSIS — K7460 Unspecified cirrhosis of liver: Secondary | ICD-10-CM

## 2014-01-02 DIAGNOSIS — R531 Weakness: Secondary | ICD-10-CM

## 2014-01-02 DIAGNOSIS — G4700 Insomnia, unspecified: Secondary | ICD-10-CM | POA: Diagnosis present

## 2014-01-02 DIAGNOSIS — I129 Hypertensive chronic kidney disease with stage 1 through stage 4 chronic kidney disease, or unspecified chronic kidney disease: Secondary | ICD-10-CM | POA: Diagnosis present

## 2014-01-02 DIAGNOSIS — D61818 Other pancytopenia: Secondary | ICD-10-CM

## 2014-01-02 DIAGNOSIS — I10 Essential (primary) hypertension: Secondary | ICD-10-CM

## 2014-01-02 DIAGNOSIS — E871 Hypo-osmolality and hyponatremia: Secondary | ICD-10-CM

## 2014-01-02 DIAGNOSIS — K7290 Hepatic failure, unspecified without coma: Secondary | ICD-10-CM

## 2014-01-02 DIAGNOSIS — E860 Dehydration: Secondary | ICD-10-CM

## 2014-01-02 DIAGNOSIS — N179 Acute kidney failure, unspecified: Secondary | ICD-10-CM

## 2014-01-02 DIAGNOSIS — Z515 Encounter for palliative care: Secondary | ICD-10-CM

## 2014-01-02 DIAGNOSIS — D649 Anemia, unspecified: Secondary | ICD-10-CM

## 2014-01-02 DIAGNOSIS — E119 Type 2 diabetes mellitus without complications: Secondary | ICD-10-CM

## 2014-01-02 DIAGNOSIS — Z87891 Personal history of nicotine dependence: Secondary | ICD-10-CM

## 2014-01-02 DIAGNOSIS — D689 Coagulation defect, unspecified: Secondary | ICD-10-CM

## 2014-01-02 DIAGNOSIS — K7682 Hepatic encephalopathy: Principal | ICD-10-CM

## 2014-01-02 DIAGNOSIS — Z66 Do not resuscitate: Secondary | ICD-10-CM | POA: Diagnosis present

## 2014-01-02 DIAGNOSIS — D696 Thrombocytopenia, unspecified: Secondary | ICD-10-CM

## 2014-01-02 DIAGNOSIS — R188 Other ascites: Secondary | ICD-10-CM

## 2014-01-02 DIAGNOSIS — K769 Liver disease, unspecified: Secondary | ICD-10-CM | POA: Diagnosis present

## 2014-01-02 LAB — COMPREHENSIVE METABOLIC PANEL
ALBUMIN: 3.3 g/dL — AB (ref 3.5–5.2)
ALK PHOS: 145 U/L — AB (ref 39–117)
ALT: 34 U/L (ref 0–53)
AST: 32 U/L (ref 0–37)
BILIRUBIN TOTAL: 2.7 mg/dL — AB (ref 0.3–1.2)
BUN: 57 mg/dL — AB (ref 6–23)
CHLORIDE: 103 meq/L (ref 96–112)
CO2: 18 mEq/L — ABNORMAL LOW (ref 19–32)
Calcium: 9.2 mg/dL (ref 8.4–10.5)
Creatinine, Ser: 3.03 mg/dL — ABNORMAL HIGH (ref 0.50–1.35)
GFR calc Af Amer: 22 mL/min — ABNORMAL LOW (ref >90)
GFR calc non Af Amer: 19 mL/min — ABNORMAL LOW (ref >90)
Glucose, Bld: 135 mg/dL — ABNORMAL HIGH (ref 70–99)
Potassium: 4.3 mEq/L (ref 3.7–5.3)
SODIUM: 136 meq/L — AB (ref 137–147)
TOTAL PROTEIN: 6 g/dL (ref 6.0–8.3)

## 2014-01-02 LAB — CBC WITH DIFFERENTIAL/PLATELET
BASOS PCT: 0 % (ref 0–1)
Basophils Absolute: 0 10*3/uL (ref 0.0–0.1)
Eosinophils Absolute: 0.1 10*3/uL (ref 0.0–0.7)
Eosinophils Relative: 3 % (ref 0–5)
HEMATOCRIT: 28.8 % — AB (ref 39.0–52.0)
Hemoglobin: 9.9 g/dL — ABNORMAL LOW (ref 13.0–17.0)
Lymphocytes Relative: 8 % — ABNORMAL LOW (ref 12–46)
Lymphs Abs: 0.3 10*3/uL — ABNORMAL LOW (ref 0.7–4.0)
MCH: 34.4 pg — AB (ref 26.0–34.0)
MCHC: 34.4 g/dL (ref 30.0–36.0)
MCV: 100 fL (ref 78.0–100.0)
Monocytes Absolute: 0.3 10*3/uL (ref 0.1–1.0)
Monocytes Relative: 9 % (ref 3–12)
Neutro Abs: 3.1 10*3/uL (ref 1.7–7.7)
Neutrophils Relative %: 80 % — ABNORMAL HIGH (ref 43–77)
Platelets: 44 10*3/uL — ABNORMAL LOW (ref 150–400)
RBC: 2.88 MIL/uL — ABNORMAL LOW (ref 4.22–5.81)
RDW: 18.9 % — AB (ref 11.5–15.5)
Smear Review: DECREASED
WBC: 3.8 10*3/uL — AB (ref 4.0–10.5)

## 2014-01-02 LAB — AMMONIA: Ammonia: 93 umol/L — ABNORMAL HIGH (ref 11–60)

## 2014-01-02 LAB — APTT: aPTT: 40 seconds — ABNORMAL HIGH (ref 24–37)

## 2014-01-02 LAB — PROTIME-INR
INR: 2.1 — AB (ref 0.00–1.49)
PROTHROMBIN TIME: 22.9 s — AB (ref 11.6–15.2)

## 2014-01-02 MED ORDER — LACTULOSE 10 GM/15ML PO SOLN: 10.0000 g | Freq: Two times a day (BID) | ORAL | Status: DC

## 2014-01-02 MED ORDER — SODIUM BICARBONATE 650 MG PO TABS
650.0000 mg | ORAL_TABLET | Freq: Two times a day (BID) | ORAL | Status: DC
  Administered 2014-01-02 – 2014-01-03 (×3): 650 mg via ORAL
  Filled 2014-01-02 (×4): qty 1

## 2014-01-02 MED ORDER — CIPROFLOXACIN HCL 250 MG PO TABS
500.0000 mg | ORAL_TABLET | Freq: Every day | ORAL | Status: DC
  Administered 2014-01-03 – 2014-01-04 (×2): 500 mg via ORAL
  Filled 2014-01-02 (×2): qty 2

## 2014-01-02 MED ORDER — ACETAMINOPHEN 325 MG PO TABS: 650.0000 mg | ORAL_TABLET | Freq: Four times a day (QID) | ORAL | Status: DC | PRN

## 2014-01-02 MED ORDER — ONDANSETRON HCL 4 MG PO TABS: 4.0000 mg | ORAL_TABLET | Freq: Four times a day (QID) | ORAL | Status: DC | PRN

## 2014-01-02 MED ORDER — ALUM & MAG HYDROXIDE-SIMETH 200-200-20 MG/5ML PO SUSP: 30.0000 mL | Freq: Four times a day (QID) | ORAL | Status: DC | PRN

## 2014-01-02 MED ORDER — LACTULOSE 10 GM/15ML PO SOLN
30.0000 g | Freq: Once | ORAL | Status: AC
  Administered 2014-01-02: 30 g via ORAL
  Filled 2014-01-02: qty 60

## 2014-01-02 MED ORDER — CIPROFLOXACIN HCL 250 MG PO TABS: 500.0000 mg | ORAL_TABLET | Freq: Two times a day (BID) | ORAL | Status: DC

## 2014-01-02 MED ORDER — ALLOPURINOL 100 MG PO TABS
100.0000 mg | ORAL_TABLET | Freq: Every day | ORAL | Status: DC
  Administered 2014-01-03: 100 mg via ORAL
  Filled 2014-01-02: qty 1

## 2014-01-02 MED ORDER — LACTULOSE 10 GM/15ML PO SOLN
30.0000 g | ORAL | Status: DC
  Administered 2014-01-02 – 2014-01-03 (×2): 30 g via ORAL
  Filled 2014-01-02 (×3): qty 60

## 2014-01-02 MED ORDER — PANTOPRAZOLE SODIUM 40 MG PO TBEC
40.0000 mg | DELAYED_RELEASE_TABLET | Freq: Every day | ORAL | Status: DC
  Administered 2014-01-03: 40 mg via ORAL
  Filled 2014-01-02 (×2): qty 1

## 2014-01-02 MED ORDER — MEGESTROL ACETATE 400 MG/10ML PO SUSP
200.0000 mg | Freq: Two times a day (BID) | ORAL | Status: DC
  Administered 2014-01-02 – 2014-01-04 (×4): 200 mg via ORAL
  Filled 2014-01-02 (×4): qty 10

## 2014-01-02 MED ORDER — HYDROCODONE-ACETAMINOPHEN 5-325 MG PO TABS
1.0000 | ORAL_TABLET | ORAL | Status: DC | PRN
  Administered 2014-01-03: 2 via ORAL
  Filled 2014-01-02: qty 2

## 2014-01-02 MED ORDER — SODIUM CHLORIDE 0.9 % IV SOLN
INTRAVENOUS | Status: DC
  Administered 2014-01-02 – 2014-01-04 (×2): via INTRAVENOUS

## 2014-01-02 MED ORDER — PROPRANOLOL HCL 20 MG PO TABS
10.0000 mg | ORAL_TABLET | Freq: Two times a day (BID) | ORAL | Status: DC
  Administered 2014-01-02 – 2014-01-03 (×3): 10 mg via ORAL
  Filled 2014-01-02 (×4): qty 1

## 2014-01-02 MED ORDER — SODIUM CHLORIDE 0.9 % IJ SOLN: 3.0000 mL | Freq: Two times a day (BID) | INTRAMUSCULAR | Status: DC

## 2014-01-02 MED ORDER — SODIUM CHLORIDE 0.9 % IJ SOLN: 3.0000 mL | INTRAMUSCULAR | Status: DC | PRN

## 2014-01-02 MED ORDER — SODIUM CHLORIDE 0.9 % IV SOLN: 250.0000 mL | INTRAVENOUS | Status: DC | PRN

## 2014-01-02 MED ORDER — ONDANSETRON HCL 4 MG/2ML IJ SOLN: 4.0000 mg | Freq: Four times a day (QID) | INTRAMUSCULAR | Status: DC | PRN

## 2014-01-02 MED ORDER — ACETAMINOPHEN 650 MG RE SUPP: 650.0000 mg | Freq: Four times a day (QID) | RECTAL | Status: DC | PRN

## 2014-01-02 NOTE — ED Notes (Signed)
NP in room with pt and family.
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
## 2014-01-02 NOTE — ED Notes (Signed)
Pt with hx of cirrhosis, elevated ammonia levels and decreased level of consciousness. Pt unresponsive this am.

## 2014-01-02 NOTE — H&P (Signed)
Patient seen, independently examined and chart reviewed. I agree with exam, assessment and plan discussed with Dyanne Carrel, NP.  73 year old man well-known to me with history of cirrhosis secondary to fatty liver disease, recurrent ascites and hepatic encephalopathy who has now been admitted for the third time in 3 weeks for decompensated liver disease with hepatic encephalopathy. On last admission, GI Dr. Laural Golden recommended hospice. Patient and wife were agreeable, lactulose was adjusted and patient was discharged with hospice. According to chart review, hospice refused to provide ongoing palliative paracentesis and therefore wife removed the patient from hospice. Last paracentesis 2/16.  Patient presented today with somnolence, concern for hyperammonemia and recurrent hepatic encephalopathy. Reported compliance at home with lactulose, no systemic symptoms lately.  PMH Cirrhosis secondary to fatty liver disease with associated ascites, pancytopenia, coagulopathy, recurrent hepatic encephalopathy Chronic kidney disease stage III with associated metabolic acidosis Diabetes mellitus type 2  Subjective: Currently he denies pain, he has no complaints.  Objective: Afebrile, vital signs stable. No hypoxia. He appears chronically ill but in no acute distress, lying flat. His speech is fluent and clear, he is oriented to hospital, month, year. He has bitemporal wasting. Cardiovascular regular rate and rhythm. No murmur, rub or gallop. 2+ bilateral lower extremity edema. Respiratory clear to auscultation bilaterally. No wheezes, rales or rhonchi. Normal respiratory effort. Abdomen is distended, soft, nontender. Skin appears grossly unremarkable.  CO2 18, BUN elevated 57, creatinine elevated 3.03. Ammonia 93. Total bilirubin 2.7. Pancytopenia without significant change. INR is 2.1 secondary to liver disease. EKG sinus rhythm with no acute changes.  Charles Hull has end-stage liver disease and  palliative/hospice care has been recommended by his gastroenterologist. He is not a candidate for TIPS or liver transplant. This is now his third hospitalization in less than one month for hepatic encephalopathy. His prognosis is grim. For now will treat for presumed hepatic encephalopathy based on history, hyperammonemia with lactulose. There is no evidence of infection and he has no abdominal pain. His chronic kidney disease appears to be at baseline. He does appear to be dehydrated, provide fluids but monitor volume status closely.  Will plan goals of care meeting with his wife 2/24. Hospice would be most appropriate.  Murray Hodgkins, MD [facility] [phone]

## 2014-01-02 NOTE — ED Notes (Signed)
DNR order acknowledged, bracelet placed on pt.

## 2014-01-03 ENCOUNTER — Encounter (HOSPITAL_COMMUNITY): Payer: Self-pay

## 2014-01-03 DIAGNOSIS — K7682 Hepatic encephalopathy: Secondary | ICD-10-CM

## 2014-01-03 DIAGNOSIS — K7290 Hepatic failure, unspecified without coma: Secondary | ICD-10-CM

## 2014-01-03 DIAGNOSIS — N179 Acute kidney failure, unspecified: Secondary | ICD-10-CM

## 2014-01-03 DIAGNOSIS — K7460 Unspecified cirrhosis of liver: Secondary | ICD-10-CM

## 2014-01-03 LAB — CBC
HCT: 25.1 % — ABNORMAL LOW (ref 39.0–52.0)
HEMOGLOBIN: 8.4 g/dL — AB (ref 13.0–17.0)
MCH: 33.9 pg (ref 26.0–34.0)
MCHC: 33.5 g/dL (ref 30.0–36.0)
MCV: 101.2 fL — ABNORMAL HIGH (ref 78.0–100.0)
Platelets: 41 10*3/uL — ABNORMAL LOW (ref 150–400)
RBC: 2.48 MIL/uL — ABNORMAL LOW (ref 4.22–5.81)
RDW: 19.4 % — AB (ref 11.5–15.5)
WBC: 3.4 10*3/uL — AB (ref 4.0–10.5)

## 2014-01-03 LAB — BASIC METABOLIC PANEL
BUN: 59 mg/dL — ABNORMAL HIGH (ref 6–23)
CHLORIDE: 110 meq/L (ref 96–112)
CO2: 18 mEq/L — ABNORMAL LOW (ref 19–32)
CREATININE: 2.87 mg/dL — AB (ref 0.50–1.35)
Calcium: 8.8 mg/dL (ref 8.4–10.5)
GFR calc Af Amer: 24 mL/min — ABNORMAL LOW (ref >90)
GFR calc non Af Amer: 21 mL/min — ABNORMAL LOW (ref >90)
Glucose, Bld: 112 mg/dL — ABNORMAL HIGH (ref 70–99)
POTASSIUM: 3.9 meq/L (ref 3.7–5.3)
SODIUM: 142 meq/L (ref 137–147)

## 2014-01-03 LAB — AMMONIA: AMMONIA: 56 umol/L (ref 11–60)

## 2014-01-03 MED ORDER — LACTULOSE 10 GM/15ML PO SOLN
30.0000 g | Freq: Three times a day (TID) | ORAL | Status: DC
  Administered 2014-01-03 – 2014-01-04 (×2): 30 g via ORAL
  Filled 2014-01-03 (×2): qty 60

## 2014-01-03 MED ORDER — RIFAXIMIN 550 MG PO TABS
550.0000 mg | ORAL_TABLET | Freq: Two times a day (BID) | ORAL | Status: DC
  Filled 2014-01-03 (×7): qty 1

## 2014-01-03 MED ORDER — LORAZEPAM 0.5 MG PO TABS
0.5000 mg | ORAL_TABLET | Freq: Every evening | ORAL | Status: DC | PRN
Start: 2014-01-03 — End: 2014-01-04

## 2014-01-03 NOTE — Care Management Note (Addendum)
    Page 1 of 1   01/04/2014     2:10:07 PM   CARE MANAGEMENT NOTE 01/04/2014  Patient:  Charles Hull, Charles Hull   Account Number:  [id number]  Date Initiated:  01/03/2014  Documentation initiated by:  Theophilus Kinds  Subjective/Objective Assessment:   Pt admitted from home with hepatic encephalopathy. Pt lives with his wife and will return home at discharge. Pt refused hospice services earlier in the month but now is agreeable.     Action/Plan:   Pt and pts wife choose [facility] (per choice). Clinicals faxed via PL and referral called. Hospice to arrange hospital bed for home. Will continue to follow.   Anticipated DC Date:  01/04/2014   Anticipated DC Plan:  [place]  CM consult      PAC Choice  HOSPICE   Choice offered to / List presented to:  C-1 Patient           Status of service:  Completed, signed off Medicare Important Message given?  YES (If response is "NO", the following Medicare IM given date fields will be blank) Date Medicare IM given:  01/04/2014 Date Additional Medicare IM given:    Discharge Disposition:  [place]  Per UR Regulation:    If discussed at Long Length of Stay Meetings, dates discussed:    Comments:  01/04/14 [place], RN BSN CM Pt discharging home with [facility] today. D/C summary sent via PL to Hospice. Hospital bed delivered per Hospice arrangements. No other CM needs noted.  01/03/14 [place], RN BSN CM

## 2014-01-03 NOTE — Progress Notes (Signed)
PROGRESS NOTE  Charles Hull IWP:[id number] DOB: 1941-10-29 DOA: 01/02/2014 PCP: Charles Howells, MD  Summary: 73 year old man well-known to me with history of cirrhosis secondary to fatty liver disease, recurrent ascites and hepatic encephalopathy. On last admission, GI Dr. Laural Hull recommended hospice. Patient and wife were agreeable, lactulose was adjusted and patient was discharged with hospice. According to chart review, hospice refused to provide ongoing palliative paracentesis and therefore wife removed the patient from hospice.  He presented to the emergency department with confusion, hyperammonemia. Hepatic encephalopathy is modestly improved with aggressive lactulose and normalization of serum ammonia. 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 He is not a candidate for TIPS or liver transplant. This is now his third hospitalization in less than one month for hepatic encephalopathy. His prognosis is grim. The patient does not want to be hospitalized again, is interested in hospice and wants to be allowed to die at home. He stated this in front of multiple family members and his wife. I discussed the patient's wishes with wife today as well as family members. Wife wants to take him home when he is ready, and I think hospice could provide good support her. I think comfort care with MOST form and no rehospitalization would be the best approach based both on prognosis as well as the patient's repeatedly expressed desires. Family is going to consider this.  Assessment/Plan: 1. Acute hepatic encephalopathy. Modestly improved with aggressive lactulose, 3 bowel movements, normalization of serum ammonia. 2. Dehydration. Improved today with fluids. The patient does not seem to be able to tolerate Lasix. 3. Acute renal failure superimposed on chronic kidney disease stage III. Lasix discontinued. Continue IVF. Chronic lower extremity edema  stable. 4. 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 Stable. Continue sodium bicarbonate. 5. 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 Per GI he is not a candidate for TIPS or liver transplant. Continue Cipro for SBP prophylaxis, propranolol.  6. Ascites appears to be at baseline. No paracentesis is required at this point. 7. Pancytopenia secondary to liver disease. Stable. 8. Diet controlled diabetes mellitus. Fasting blood sugar 112. No treatment indicated.   Continue lactulose for goal minimum 2 BM per day.  BMP, ammonia in AM. IVF today.   Xifaxan per GI. Ativan for insomnia per GI.  Stop Lasix indefinitely. Patient does not seem to be able to tolerate this, results in dehydration.  Code Status: DNR DVT prophylaxis: SCDs Family Communication: as above Disposition Plan: home  Charles Hodgkins, MD  [facility]  Pager [phone] If 7PM-7AM, please contact night-coverage at [email or web address], password Charles Hull 01/03/2014, 3:29 PM  LOS: 1 day   Consultants:  Gastroenterology  Procedures:    Antibiotics:  Cipro for SBP prophylaxis  HPI/Subjective: Miserable. No pain. "Let me die. I don't won't to be in the hospital. I don't want to come back to the hospital if I get sick again."  Objective: Filed Vitals:   01/02/14 1614 01/02/14 1700 01/02/14 2100 01/03/14 0556  BP:  127/65 112/72 118/56  Pulse:  89 99 100  Temp:  97.8 F (36.6 C) 97.9 F (36.6 C) 97.9 F (36.6 C)  TempSrc:  Oral Oral Oral  Resp:  18 20 20   Height: 5\' 8"  (1.727 m)     Weight: 71.3 kg (157 lb 3 oz)     SpO2:  100% 100% 100%   No intake or output data in the 24 hours ending 01/03/14 Charles Hull  01/02/14 1614  Weight: 71.3 kg (157 lb 3 oz)    Exam:   Afebrile, vital signs stable. No hypoxia.  Gen. Appears calm and comfortable. Speech fluent and clear. Alert and participates in conversation and exam.  Cardiovascular regular rate and rhythm. No  murmur, rub or gallop. Stable 2+ bilateral lower extremity edema.  Respiratory clear to auscultation bilaterally. No wheezes, rales or rhonchi. Normal respiratory effort.  Abdomen distended, nontender. Not tense.  Skin grossly unremarkable.  Data Reviewed:  3 BMs last night  Creatinine 3.03 >> 2.87. No change in BUN or CO2  Hemoglobin somewhat decreased, a 0.4. Platelet count and white blood cell count stable.  Serum ammonia 56  Scheduled Meds: . allopurinol  100 mg Oral Daily  . ciprofloxacin  500 mg Oral Q breakfast  . lactulose  30 g Oral Q2H  . megestrol  200 mg Oral BID  . pantoprazole  40 mg Oral Daily  . propranolol  10 mg Oral BID  . rifaximin  550 mg Oral BID  . sodium bicarbonate  650 mg Oral BID   Continuous Infusions: . sodium chloride 50 mL/hr at 01/02/14 2044    Principal Problem:   Hepatic encephalopathy Active Problems:   Hypertension   Ascites   Cirrhosis of liver   CKD (chronic kidney disease) stage 3, GFR 30-59 ml/min   Pancytopenia   DM type 2 (diabetes mellitus, type 2)   Hyponatremia   Time spent 35  minutes, Greater than 50% in counseling and coordination of care

## 2014-01-03 NOTE — Progress Notes (Signed)
UR chart review completed.  
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
## 2014-01-04 ENCOUNTER — Other Ambulatory Visit (INDEPENDENT_AMBULATORY_CARE_PROVIDER_SITE_OTHER): Payer: Self-pay | Admitting: *Deleted

## 2014-01-04 ENCOUNTER — Encounter (INDEPENDENT_AMBULATORY_CARE_PROVIDER_SITE_OTHER): Payer: Self-pay | Admitting: *Deleted

## 2014-01-04 DIAGNOSIS — R188 Other ascites: Secondary | ICD-10-CM

## 2014-01-04 DIAGNOSIS — K7460 Unspecified cirrhosis of liver: Secondary | ICD-10-CM

## 2014-01-04 DIAGNOSIS — D689 Coagulation defect, unspecified: Secondary | ICD-10-CM

## 2014-01-04 DIAGNOSIS — I10 Essential (primary) hypertension: Secondary | ICD-10-CM

## 2014-01-04 DIAGNOSIS — E860 Dehydration: Secondary | ICD-10-CM

## 2014-01-04 DIAGNOSIS — R5381 Other malaise: Secondary | ICD-10-CM

## 2014-01-04 DIAGNOSIS — E119 Type 2 diabetes mellitus without complications: Secondary | ICD-10-CM

## 2014-01-04 DIAGNOSIS — R5383 Other fatigue: Secondary | ICD-10-CM

## 2014-01-04 LAB — BASIC METABOLIC PANEL
BUN: 53 mg/dL — ABNORMAL HIGH (ref 6–23)
CALCIUM: 8.6 mg/dL (ref 8.4–10.5)
CO2: 17 meq/L — AB (ref 19–32)
CREATININE: 2.62 mg/dL — AB (ref 0.50–1.35)
Chloride: 112 mEq/L (ref 96–112)
GFR calc Af Amer: 27 mL/min — ABNORMAL LOW (ref >90)
GFR, EST NON AFRICAN AMERICAN: 23 mL/min — AB (ref >90)
Glucose, Bld: 122 mg/dL — ABNORMAL HIGH (ref 70–99)
Potassium: 3.8 mEq/L (ref 3.7–5.3)
SODIUM: 142 meq/L (ref 137–147)

## 2014-01-04 LAB — AMMONIA: AMMONIA: 203 umol/L — AB (ref 11–60)

## 2014-01-04 MED ORDER — RIFAXIMIN 550 MG PO TABS: 550.0000 mg | ORAL_TABLET | Freq: Two times a day (BID) | ORAL | Status: DC

## 2014-01-04 MED ORDER — LORAZEPAM 0.5 MG PO TABS: 0.5000 mg | ORAL_TABLET | Freq: Every evening | ORAL | Status: DC | PRN

## 2014-01-04 MED ORDER — LACTULOSE 10 GM/15ML PO SOLN
30.0000 g | Freq: Once | ORAL | Status: AC
  Administered 2014-01-04: 30 g via ORAL
  Filled 2014-01-04: qty 60

## 2014-01-04 NOTE — Discharge Summary (Signed)
Physician Discharge Summary  Charles Hull ZYS:[id number] DOB: 1941-08-26 DOA: 01/02/2014  PCP: Shanda Howells, MD  Admit date: 01/02/2014 Discharge date: 01/04/2014  Time spent: 40 minutes  Recommendations for Outpatient Follow-up:  1. Being discharged with [facility] [place] to manage pain and coordinate comfort care  Discharge Diagnoses:  Principal Problem:   Hepatic encephalopathy Active Problems:   Hypertension   Ascites   Cirrhosis of liver   CKD (chronic kidney disease) stage 3, GFR 30-59 ml/min   Pancytopenia   DM type 2 (diabetes mellitus, type 2)   Hyponatremia   Acute renal failure   Discharge Condition: fair  Diet recommendation: carb modified  Filed Weights   01/02/14 1614 01/04/14 0410  Weight: 71.3 kg (157 lb 3 oz) 74.5 kg (164 lb 3.9 oz)    History of present illness:  Charles Hull is a 73 y.o. male with history of cirrhosis secondary to fatty liver disease, recurrent ascites and hepatic encephalopathy just recently discharged for the same presented 01/02/14 with chief complaint of altered mental status. In the emergency department he was found to have hyperammonemia and acute on chronic renal failure.  History obtained from the wife who indicated that he was in his usual state of health ambulating around the house with his walker the day prior to admission. She stated he had been taking his lactulose and had 3 BMs day prior to [place]. On morning of admission he was very difficult to arouse. There had been no fever chills complaints of pain. There had been no nausea vomiting. Had been no worsening of ascites. Wife reported it has been almost 2 weeks since his last paracentesis. Wife reports he's been eating and drinking his normal amount.   In the emergency department he was afebrile and had stable vital signs. The creatinine is elevated at 3.031 baseline is 2.07. Alkaline phosphatase and total bilirubin elevated but very close to levels 2 weeks ago.  Pancytopenia stable. EKG with normal sinus rhythm and PACs. When compared to EKG 12/18/2013 PACs present.   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 On last admission, GI Dr. Laural Golden recommended hospice. Patient and wife were agreeable, lactulose was adjusted and patient was discharged with hospice. According to chart review, hospice refused to provide ongoing palliative paracentesis and therefore wife removed the patient from hospice.  He presented to the emergency department with confusion, hyperammonemia. Hepatic encephalopathy is modestly improved with aggressive lactulose and normalization of serum ammonia. 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 He is not a candidate for TIPS or liver transplant. This is now his third hospitalization in less than one month for hepatic encephalopathy. His prognosis is grim. The patient does not want to be hospitalized again, is interested in hospice and wants to be allowed to die at home. He stated this in front of multiple family members and his wife. Discussed the patient's wishes with wife  as well as family members. Wife wanted to take him home when he is ready. Comfort care with MOST form and no rehospitalization would be the best approach based both on prognosis as well as the patient's repeatedly expressed desires.    2. Acute hepatic encephalopathy. Modestly improved with aggressive lactulose, 3 bowel movements. Amonia level elevated this am as only 2 doses of lactulose since admission. Patient only slightly lethargic. Resume lacutlose BID.  Give extra dose this am.  3. Dehydration. Improved with fluids. The patient does not seem to be able to tolerate Lasix. Will continue to hold lasix.  4. Acute renal failure superimposed on chronic kidney disease stage III.  Lasix discontinued. Provided with  IVF. Creatinine trending downward close to baseline.  Chronic lower extremity edema stable. 5. 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 Stable. Continue sodium bicarbonate. 6. 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 Per GI he is not a candidate for TIPS or liver transplant. Continue Provided cipro for SBP prophylaxis, propranolol. Evaluated by GI this hospitalization who opine that MELD score 29 on admission which carries 3 month mortalitiy of >75% and recommend  Hospice, xifaxan BID will improve sensorium without extending life, provide ativan vs zolpidem at bedtime. Xifaxan discontinued at discharge as Hospice will not accept patient if on this medication.  7. Ascites appears to be at baseline. No paracentesis is required at this point. 8. Pancytopenia secondary to liver disease. Stable. 9. Diet controlled diabetes mellitus. Fasting blood sugar 112. No treatment indicated.   Procedures:  none  Consultations:  Dr. Laural Golden gastroenterology  Discharge Exam: Filed Vitals:   01/04/14 0410  BP: 108/61  Pulse: 79  Temp: 98.4 F (36.9 C)  Resp: 20    General: calm and appears comfortable. Denies pain Cardiovascular: RRR No MGR trace -1+ LE edema Respiratory: normal effort BS clear bilaterally no wheeze no rhonchi  Discharge Instructions   Future Appointments Provider Department Dept Phone   02/06/2014 3:30 PM Rogene Houston, MD [place] [phone]   06/01/2014 9:00 AM [facility] [phone]   Eat a light meal the night before the exam Nothing to eat or drink for at least 8 hours before exam No gum chewing, or smoking the morning of the exam. Please take your morning medications with small sips of water, especially blood pressure medication *Very Important* Please wear 2 piece clothing   06/01/2014 9:30 AM [facility]  ST [id number]   06/01/2014 10:30 AM Elam Dutch, MD [facility] [phone]       Medication List    STOP taking these medications       furosemide 20 MG tablet  Commonly known as:  LASIX     zolpidem 5 MG tablet  Commonly known as:  AMBIEN      TAKE these medications       doxazosin 4 MG tablet  Commonly known as:  CARDURA  Take 4 mg by mouth at bedtime.     lactulose 10 GM/15ML solution  Commonly known as:  CHRONULAC  Take 15 mLs (10 g total) by mouth 2 (two) times daily.     LORazepam 0.5 MG tablet  Commonly known as:  ATIVAN  Take 1 tablet (0.5 mg total) by mouth at bedtime as needed for sleep.     megestrol 400 MG/10ML suspension  Commonly known as:  MEGACE  Take 200 mg by mouth 2 (two) times daily.     pantoprazole 40 MG tablet  Commonly known as:  PROTONIX  Take 40 mg by mouth daily.     propranolol 10 MG tablet  Commonly known as:  INDERAL  Take 1 tablet (10 mg total) by mouth 2 (two) times daily.     rifaximin 550 MG Tabs tablet  Commonly known as:  XIFAXAN  Take 1 tablet (550 mg total) by mouth  2 (two) times daily.     sodium bicarbonate 650 MG tablet  Take 1 tablet (650 mg total) by mouth 2 (two) times daily.       Allergies  Allergen Reactions  . Lipitor [Atorvastatin Calcium]       The results of significant diagnostics from this hospitalization (including imaging, microbiology, ancillary and laboratory) are listed below for reference.    Significant Diagnostic Studies: Ct Head Wo Contrast  12/12/2013   CLINICAL DATA:  Altered mental status  EXAM: CT HEAD WITHOUT CONTRAST  TECHNIQUE: Contiguous axial images were obtained from the base of the skull through the vertex without intravenous contrast. Study was obtained within 24 hr of patient's arrival at the emergency department.  COMPARISON:  June 07, 2010  FINDINGS: Ventricles are normal in size and configuration. There is no mass, hemorrhage, extra-axial  fluid collection, or midline shift. Gray-white compartments appear within normal limits. There is no demonstrable acute infarct.  Bony calvarium appears intact. There is diffuse opacification of mastoids on the right, a stable finding. There is mucosal thickening in the left maxillary antrum with a retention cyst inferiorly.  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 Left maxillary sinus disease, not present on prior study.  No intracranial lesion appreciated. In particular, no intracranial mass, hemorrhage, or acute appearing infarct.   Electronically Signed   By: Lowella Grip M.D.   On: 12/12/2013 16:30   US Paracentesis  12/26/2013   CLINICAL DATA:  Cirrhosis, ascites  EXAM: ULTRASOUND GUIDED THERAPEUTIC PARACENTESIS  COMPARISON:  12/19/2013  PROCEDURE: Procedure, benefits, and risks of procedure were discussed with patient.  Written informed consent for procedure was obtained.  Time out protocol followed.  Adequate collection of ascites localized in left lower quadrant.  Skin prepped and draped in usual sterile fashion.  Skin and soft tissues anesthetized with 10 mL of 1% lidocaine.  5 [place] Yueh catheter placed into peritoneal cavity.  7000 mL of yellow fluid aspirated by vacuum bottle suction.  Procedure tolerated well by patient without immediate complication.  FINDINGS: A total of approximately 7000 mL of yellow fluid fluid was removed.  IMPRESSION: Successful ultrasound guided paracentesis yielding 7000 mL of ascites.   Electronically Signed   By: Lavonia Dana M.D.   On: 12/26/2013 17:08   US Paracentesis  12/19/2013   CLINICAL DATA:  Cirrhosis, ascites  EXAM: ULTRASOUND GUIDED THERAPEUTIC PARACENTESIS  COMPARISON:  12/13/2013  PROCEDURE: Procedure, benefits, and risks of procedure were discussed with patient.  Written informed consent for procedure was obtained.  Time out protocol followed.  Adequate collection of ascites localized in right lower quadrant.  Skin prepped and draped in  usual sterile fashion.  Skin and soft tissues anesthetized with 8 mL of 1% lidocaine.  5 [place] Yueh catheter placed into peritoneal cavity.  5600 mL of speech colored fluid aspirated by vacuum bottle suction.  Procedure tolerated well by patient without immediate complication.  FINDINGS: As above  IMPRESSION: Successful ultrasound guided paracentesis yielding 5600 mL of ascites.   Electronically Signed   By: Lavonia Dana M.D.   On: 12/19/2013 17:12   US Paracentesis  12/13/2013   CLINICAL DATA:  Cirrhosis, ascites  EXAM: ULTRASOUND GUIDED THERAPEUTIC PARACENTESIS  COMPARISON:  12/01/2013  PROCEDURE: Procedure, benefits, and risks of procedure were discussed with patient.  Written informed consent for procedure was obtained.  Time out protocol followed.  Adequate collection of ascites localized in right lower quadrant.  Skin prepped and draped in usual sterile fashion.  Skin and soft tissues anesthetized with 10 mL of 1% lidocaine.  5 [place] Yueh catheter placed into peritoneal cavity.  4000 mL of peach-colored fluid aspirated by vacuum bottle suction.  Procedure tolerated well by patient without immediate complication.  FINDINGS: A total of approximately 4000 mL of peach-colored fluid was removed. A fluid sample was sent for laboratory analysis.  IMPRESSION: Successful ultrasound guided paracentesis yielding 4000 mL of ascites.   Electronically Signed   By: Lavonia Dana M.D.   On: 12/13/2013 18:21   Dg Chest Portable 1 View  12/18/2013   CLINICAL DATA:  Nausea and vomiting. Weakness and shortness of breath. Altered mental status.  EXAM: PORTABLE CHEST - 1 VIEW  COMPARISON:  DG ABD ACUTE W/CHEST dated 12/12/2013; US PARACENTESIS dated 12/13/2013  FINDINGS: Midline trachea. Cardiomegaly accentuated by AP portable technique. Small left-sided pleural effusion is again identified. Apparent lucency in the region of the left costophrenic angle is likely related to aerated lung adjacent to pleural fluid. No apical  pneumothorax. No congestive failure. Persistent left base airspace disease.  IMPRESSION: Persistent small left pleural effusion with adjacent Airspace disease, likely atelectasis. Lucency in the region of the left costophrenic angle is favored to be related to aerated lung adjacent the pleural fluid. If there has been interval left thoracentesis, small volume pneumothorax would be difficult to exclude. PA and lateral radiographs may be informative.  Cardiomegaly without congestive failure.   Electronically Signed   By: Abigail Miyamoto M.D.   On: 12/18/2013 12:35   Dg Abd Acute W/chest  12/12/2013   CLINICAL DATA:  Weakness  EXAM: ACUTE ABDOMEN SERIES (ABDOMEN 2 VIEW & CHEST 1 VIEW)  COMPARISON:  11/29/2013  FINDINGS: Cardiac shadow is stable. Persistent left-sided effusion and underlying atelectasis is noted and slightly increased. The right lung remains clear. Scattered large and small bowel gas is noted. No free air is noted. Postsurgical changes are seen.  IMPRESSION: Increasing left-sided effusion and atelectasis.   Electronically Signed   By: Inez Catalina M.D.   On: 12/12/2013 16:19    Microbiology: No results found for this or any previous visit (from the past 240 hour(s)).   Labs: Basic Metabolic Panel:  Recent Labs Lab 01/02/14 1144 01/03/14 0553 01/04/14 0555  NA 136* 142 142  K 4.3 3.9 3.8  CL 103 110 112  CO2 18* 18* 17*  GLUCOSE 135* 112* 122*  BUN 57* 59* 53*  CREATININE 3.03* 2.87* 2.62*  CALCIUM 9.2 8.8 8.6   Liver Function Tests:  Recent Labs Lab 01/02/14 1144  AST 32  ALT 34  ALKPHOS 145*  BILITOT 2.7*  PROT 6.0  ALBUMIN 3.3*   No results found for this basename: LIPASE, AMYLASE,  in the last 168 hours  Recent Labs Lab 01/02/14 1144 01/03/14 0553 01/04/14 0555  AMMONIA 93* 56 203*   CBC:  Recent Labs Lab 01/02/14 1144 01/03/14 0553  WBC 3.8* 3.4*  NEUTROABS 3.1  --   HGB 9.9* 8.4*  HCT 28.8* 25.1*  MCV 100.0 101.2*  PLT 44* 41*   Cardiac  Enzymes: No results found for this basename: CKTOTAL, CKMB, CKMBINDEX, TROPONINI,  in the last 168 hours BNP: BNP (last 3 results)  Recent Labs  12/12/13 1508  PROBNP 604.9*   CBG: No results found for this basename: GLUCAP,  in the last 168 hours     Signed:  Radene Gunning  [facility] 01/04/2014, 9:54 AM

## 2014-01-04 NOTE — Progress Notes (Signed)
AVS reviewed with patient's wife (caregiiver).  Verbalized understanding of discharge instructions, physician follow-up and medications.  Patient's IV removed.  Hospital bed has been delivered to patient's home per patient's spouse.  [facility] to follow patient at home.  Patient transported by NT via w/c to main entrance for discharge.  Patient stable at time of discharge.

## 2014-01-04 NOTE — Discharge Summary (Signed)
Evaluated patient and agree with assessment and plan

## 2014-01-05 NOTE — Progress Notes (Signed)
UR chart review completed.  

## 2014-01-06 ENCOUNTER — Telehealth (INDEPENDENT_AMBULATORY_CARE_PROVIDER_SITE_OTHER): Payer: Self-pay | Admitting: *Deleted

## 2014-01-06 NOTE — Telephone Encounter (Signed)
Same old same old. Patient interested in hospice but wife is not. Home health care consultation requested

## 2014-01-06 NOTE — Telephone Encounter (Signed)
Olean Ree from [facility] called. Patient's wife is not ready for their services,patient is . Olean Ree made the suggestion that Dr.Rehman make a Home Health referral.

## 2014-01-09 ENCOUNTER — Encounter (HOSPITAL_COMMUNITY): Payer: Self-pay

## 2014-01-09 ENCOUNTER — Other Ambulatory Visit (INDEPENDENT_AMBULATORY_CARE_PROVIDER_SITE_OTHER): Payer: Self-pay | Admitting: Internal Medicine

## 2014-01-09 ENCOUNTER — Ambulatory Visit (HOSPITAL_COMMUNITY)
Admission: RE | Admit: 2014-01-09 | Discharge: 2014-01-09 | Disposition: A | Discharge status: Home / Self Care | Payer: Medicare HMO | Source: Ambulatory Visit | Attending: Internal Medicine | Admitting: Internal Medicine

## 2014-01-09 DIAGNOSIS — K7460 Unspecified cirrhosis of liver: Secondary | ICD-10-CM

## 2014-01-09 DIAGNOSIS — R188 Other ascites: Secondary | ICD-10-CM | POA: Insufficient documentation

## 2014-01-09 LAB — BODY FLUID CELL COUNT WITH DIFFERENTIAL
Eos, Fluid: 0 %
Lymphs, Fluid: 34 %
MONOCYTE-MACROPHAGE-SEROUS FLUID: 58 % (ref 50–90)
NEUTROPHIL FLUID: 8 % (ref 0–25)
Total Nucleated Cell Count, Fluid: 39 cu mm (ref 0–1000)

## 2014-01-09 MED ORDER — ALBUMIN HUMAN 25 % IV SOLN
INTRAVENOUS | Status: AC
  Administered 2014-01-09: 50 g via INTRAVENOUS
  Filled 2014-01-09: qty 200

## 2014-01-09 MED ORDER — ALBUMIN HUMAN 25 % IV SOLN
50.0000 g | Freq: Once | INTRAVENOUS | Status: AC
  Administered 2014-01-09: 50 g via INTRAVENOUS

## 2014-01-09 NOTE — Progress Notes (Signed)
Paracentesis complete no signs of distress. 6000 ml dark red abdominal fluid removed MD aware

## 2014-01-10 ENCOUNTER — Telehealth: Payer: Self-pay | Admitting: Family Medicine

## 2014-01-10 ENCOUNTER — Telehealth (INDEPENDENT_AMBULATORY_CARE_PROVIDER_SITE_OTHER): Payer: Self-pay | Admitting: *Deleted

## 2014-01-10 ENCOUNTER — Other Ambulatory Visit (INDEPENDENT_AMBULATORY_CARE_PROVIDER_SITE_OTHER): Payer: Self-pay | Admitting: Internal Medicine

## 2014-01-10 LAB — PATHOLOGIST SMEAR REVIEW

## 2014-01-10 MED ORDER — MEGESTROL ACETATE 400 MG/10ML PO SUSP: 200.0000 mg | Freq: Two times a day (BID) | ORAL | Status: AC

## 2014-01-10 MED ORDER — LORAZEPAM 0.5 MG PO TABS: 0.5000 mg | ORAL_TABLET | Freq: Every evening | ORAL | Status: AC | PRN

## 2014-01-10 NOTE — Telephone Encounter (Signed)
I have sent Dr.Rehman a refill request for these medications.

## 2014-01-10 NOTE — Telephone Encounter (Signed)
Son-in-law has a hospital bed for pt per wife

## 2014-01-10 NOTE — Telephone Encounter (Signed)
Ann said the pharmacy will not refill his megestrol that it was to early. When Leaf was in the hospital, the Megestrol was increased to two daily and was given another sleeping pill Lorazepam. Also, Hospice is going to come and get his bed if Dr. Laural Golden doesn't respond to them. Ann's return phone number is [phone].

## 2014-01-10 NOTE — Telephone Encounter (Signed)
Called the number provided but it just rang continuous no answer nor voicemail.

## 2014-01-10 NOTE — Telephone Encounter (Signed)
Charles Hull said the pharmacy will not refill his megestrol that it was to early. When Charles Hull was in the hospital, the Megestrol was increased to two daily and was given another sleeping pill Lorazepam. Also, Hospice is going to come and get his bed if Dr. Laural Golden doesn't respond to them. Charles Hull return phone number is [phone].  Per [facility] @ Hops[facility] , she has called Dr.Newton office and is also asking if we would write an order to [name] for a hospital bed.  They would be able to bill the patient's insurance. [name] has told patient's wife that she may keep the bed until the end of this week. I have attempted to call Charles Hull,no answer.

## 2014-01-13 LAB — BODY FLUID CULTURE
Culture: NO GROWTH
Gram Stain: NONE SEEN

## 2014-01-14 LAB — ANAEROBIC CULTURE: GRAM STAIN: NONE SEEN

## 2014-01-16 ENCOUNTER — Telehealth (INDEPENDENT_AMBULATORY_CARE_PROVIDER_SITE_OTHER): Payer: Self-pay | Admitting: *Deleted

## 2014-01-16 ENCOUNTER — Other Ambulatory Visit (INDEPENDENT_AMBULATORY_CARE_PROVIDER_SITE_OTHER): Payer: Self-pay | Admitting: Internal Medicine

## 2014-01-16 DIAGNOSIS — R188 Other ascites: Secondary | ICD-10-CM

## 2014-01-16 NOTE — Telephone Encounter (Signed)
Patient's wife Lelon Frohlich, left message that Linsey needed tap, is ok please put order in and I'll get it scheduled -- thanks

## 2014-01-16 NOTE — Telephone Encounter (Signed)
Per Karna Christmas, ok to sch'd tap -- tap sch'd 01/17/14, patient's wife aware

## 2014-01-17 ENCOUNTER — Telehealth: Payer: Self-pay | Admitting: Family Medicine

## 2014-01-17 ENCOUNTER — Encounter (HOSPITAL_COMMUNITY): Payer: Self-pay

## 2014-01-17 ENCOUNTER — Other Ambulatory Visit (INDEPENDENT_AMBULATORY_CARE_PROVIDER_SITE_OTHER): Payer: Self-pay | Admitting: Internal Medicine

## 2014-01-17 ENCOUNTER — Ambulatory Visit (HOSPITAL_COMMUNITY)
Admission: RE | Admit: 2014-01-17 | Discharge: 2014-01-17 | Disposition: A | Discharge status: Home / Self Care | Payer: Medicare HMO | Source: Ambulatory Visit | Attending: Internal Medicine | Admitting: Internal Medicine

## 2014-01-17 ENCOUNTER — Telehealth (INDEPENDENT_AMBULATORY_CARE_PROVIDER_SITE_OTHER): Payer: Self-pay | Admitting: *Deleted

## 2014-01-17 DIAGNOSIS — R109 Unspecified abdominal pain: Secondary | ICD-10-CM

## 2014-01-17 DIAGNOSIS — R188 Other ascites: Secondary | ICD-10-CM | POA: Insufficient documentation

## 2014-01-17 LAB — BODY FLUID CELL COUNT WITH DIFFERENTIAL
EOS FL: 0 %
LYMPHS FL: 6 %
Monocyte-Macrophage-Serous Fluid: 81 % (ref 50–90)
Neutrophil Count, Fluid: 3 % (ref 0–25)
OTHER CELLS FL: 0 %
Total Nucleated Cell Count, Fluid: 10 cu mm (ref 0–1000)

## 2014-01-17 MED ORDER — ALBUMIN HUMAN 25 % IV SOLN
50.0000 g | Freq: Once | INTRAVENOUS | Status: AC
  Administered 2014-01-17: 50 g via INTRAVENOUS

## 2014-01-17 MED ORDER — ALBUMIN HUMAN 25 % IV SOLN
INTRAVENOUS | Status: AC
  Administered 2014-01-17: 50 g via INTRAVENOUS
  Filled 2014-01-17: qty 200

## 2014-01-17 MED ORDER — OXYCODONE-ACETAMINOPHEN 5-325 MG PO TABS: 1.0000 | ORAL_TABLET | ORAL | Status: AC | PRN

## 2014-01-17 NOTE — Progress Notes (Signed)
Paracentesis complete no signs of distress. 4800 ml red colored abdominal fluid removed

## 2014-01-17 NOTE — Telephone Encounter (Signed)
This is being handled by Dr. Olevia Perches office. He was provided a prescription for pain medication today.

## 2014-01-17 NOTE — Telephone Encounter (Signed)
Patient's wife presented to our office prior to lunch. Patient had abdominal tap today, she states that he was awake all night,hollering in pain. She was advised to come over to [place] and ask for pain medication. Dr. Laural Golden is out of office,however , last week he wrote a prescription for Ativan. This was given to his wife when she was here.  We were also told that the patient had stopped taking all his medications last week, his choice. She was also made aware that we were contacting [facility] for Mr. Iannello.  After lunch, Mrs. Diltz had left a message that her husband needed pain medication. This has been forwarded to Deberah Castle, NP for review. [facility] is being provided with a referral.

## 2014-01-18 LAB — PATHOLOGIST SMEAR REVIEW

## 2014-01-20 ENCOUNTER — Other Ambulatory Visit (INDEPENDENT_AMBULATORY_CARE_PROVIDER_SITE_OTHER): Payer: Self-pay | Admitting: Internal Medicine

## 2014-01-20 DIAGNOSIS — K7460 Unspecified cirrhosis of liver: Secondary | ICD-10-CM

## 2014-01-20 DIAGNOSIS — R188 Other ascites: Secondary | ICD-10-CM

## 2014-01-21 LAB — BODY FLUID CULTURE

## 2014-01-23 ENCOUNTER — Telehealth: Payer: Self-pay | Admitting: Family Medicine

## 2014-01-23 LAB — ANAEROBIC CULTURE: Gram Stain: NONE SEEN

## 2014-01-24 ENCOUNTER — Telehealth (INDEPENDENT_AMBULATORY_CARE_PROVIDER_SITE_OTHER): Payer: Self-pay | Admitting: *Deleted

## 2014-01-24 ENCOUNTER — Ambulatory Visit (HOSPITAL_COMMUNITY): Payer: Medicare HMO

## 2014-01-24 NOTE — Telephone Encounter (Signed)
I have talked with Charles Mcgregor, LPN  with [facility]. Her assessment to day was as follows: patient was unable to go have tap this morning. Abdominal and lower extremity pain is really bad. Patient is unable to swallow Oxycodone. Abdominal Girth today was 39 cms. Note this is the only girth measurement taken since admit to [facility]. 1 + pitting edema lower extremity. Negative for a bowel movement in the last 7-8 days. [name] was able to get the patient to drink 2 glasses of orange juice, 1/2 glass of V 8 , while she was there.  Ask if a low dose Duragesic patch could be ordered.   I have returned the call from [facility]. A Social Worker has been to the home, He reported that the patient was in a fetal position and in great pain. It was noted that the patient is intolerant to the wife's commands/ care giving.  Forwarded to [facility] for review/ recommendation. They have made a referral to the Hospice/Palliative Care in [facility], and will need Dr.Rehman to authorize this.   We have rec'd a call from [facility] with the [place] in [facility], requesting authorization.

## 2014-01-24 NOTE — Telephone Encounter (Signed)
Home health is going to have a Education officer, museum come out and talk with them. Hospice came to the home to evaluate him a week or so ago but they didn't obtain services. There is a discrepancy about why they didn't obtain their services From his caregivers description I'm not sure that Charles Hull is capable of signing a DNR or assigning a health care power of attorney. We will wait and see what the social worker suggests.

## 2014-01-24 NOTE — Telephone Encounter (Signed)
I have called and spoke with [facility] @ Hospice[facility]Pallative Care in [place]. A verbal order was given for a Hospice Consult, per Dr.Rehman.

## 2014-01-24 NOTE — Telephone Encounter (Signed)
Charles Hull at [phone] would like a call back. She is wanting to get a verbal order for a home health aid twice a week. He is needing assistants with bathing.

## 2014-01-24 NOTE — Telephone Encounter (Signed)
I have spoke with Charles Hull . She has suggested that we wait until after the Hospice Consult. She was given a verbal order for the baths.

## 2014-01-24 NOTE — Telephone Encounter (Signed)
Okay to proceed with consultation with hospice unit of care from [facility]

## 2014-01-25 NOTE — Telephone Encounter (Signed)
Our office rec'd a call from [place]. An assessment was made and he is ready for Hospice Care. The family opted to go with a Hospice Physician as a Primary Physician. Dr.Rehman will need to provide order for Hospice Care to start, thereafter , he will rec'v information on Mr. Poli. I was allowed to give a verbal order that [place] may be started to [facility] and [facility].

## 2014-02-06 ENCOUNTER — Ambulatory Visit (INDEPENDENT_AMBULATORY_CARE_PROVIDER_SITE_OTHER): Payer: Medicare HMO | Admitting: Internal Medicine

## 2014-02-08 DEATH — deceased

## 2014-05-18 ENCOUNTER — Telehealth: Payer: Self-pay | Admitting: Vascular Surgery

## 2014-05-18 NOTE — Telephone Encounter (Signed)
Patient's wife, Lelon Frohlich, called to let us know that the patient deceased on 01-28-14. The patient has an appointment on 06/01/14 that needs to be canceled.  Debbie T.

## 2014-05-18 NOTE — Telephone Encounter (Signed)
Confirmed date of death was 16-Feb-2014  Per obituary.

## 2014-06-01 ENCOUNTER — Other Ambulatory Visit (HOSPITAL_COMMUNITY): Payer: Medicare HMO

## 2014-06-01 ENCOUNTER — Ambulatory Visit: Payer: Medicare HMO | Admitting: Vascular Surgery

## 2014-06-12 NOTE — Telephone Encounter (Signed)
Patient status changed to deceased in [facility]. Charles Hull

## 2014-11-08 ENCOUNTER — Encounter (INDEPENDENT_AMBULATORY_CARE_PROVIDER_SITE_OTHER): Payer: Self-pay

## 2015-03-22 NOTE — Progress Notes (Signed)
Patient has been deceased for long; I am just been reminded 14 months later that he needs tap.

## 2015-09-09 IMAGING — US US CAROTID DUPLEX BILAT
1 series · 13 of 24 positions shown · non-contrast
Comparison: 08/28/2009

CLINICAL DATA: Hypertension, previous tobacco use, occlusive
disease

EXAM:
BILATERAL CAROTID DUPLEX ULTRASOUND
TECHNIQUE: Gray scale imaging, color Doppler and duplex ultrasound was
performed of bilateral carotid and vertebral arteries in the neck.

[Series 1: us carotid duplex bilat · 0.04mm/px · 13 of 82 slices shown]
[im 1/82]
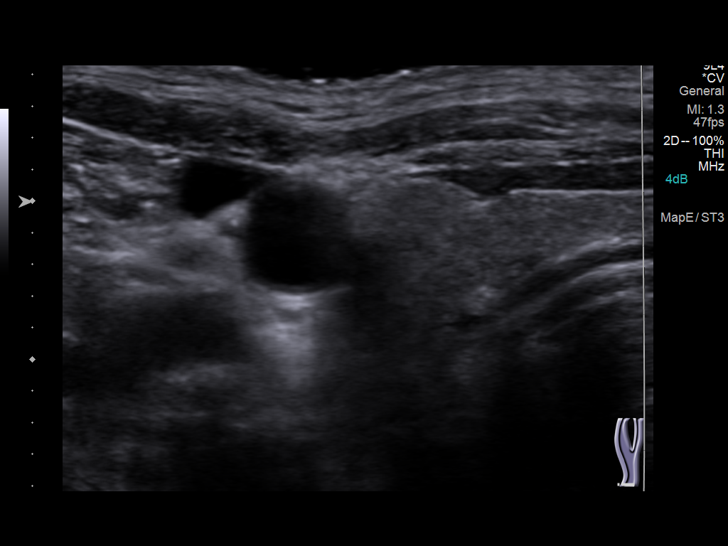
[im 8/82]
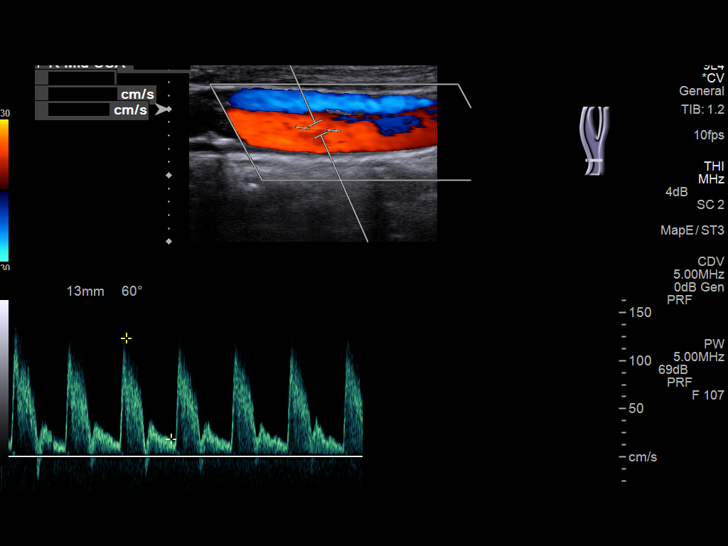
[im 15/82]
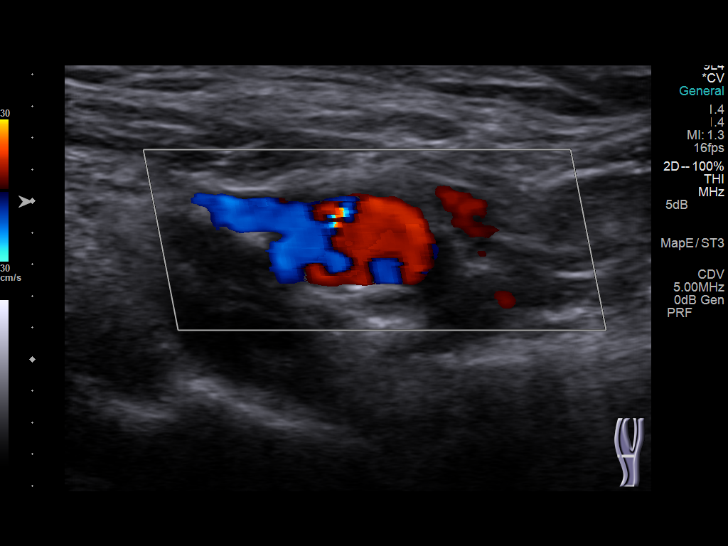
[im 22/82]
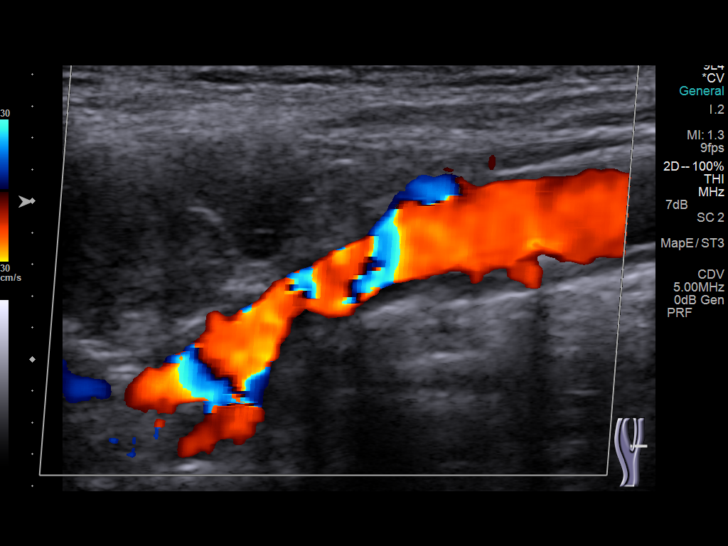
[im 29/82]
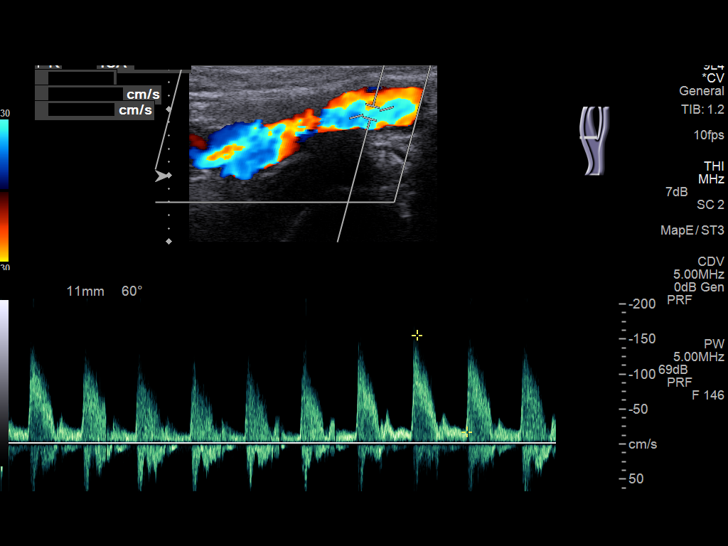
[im 36/82]
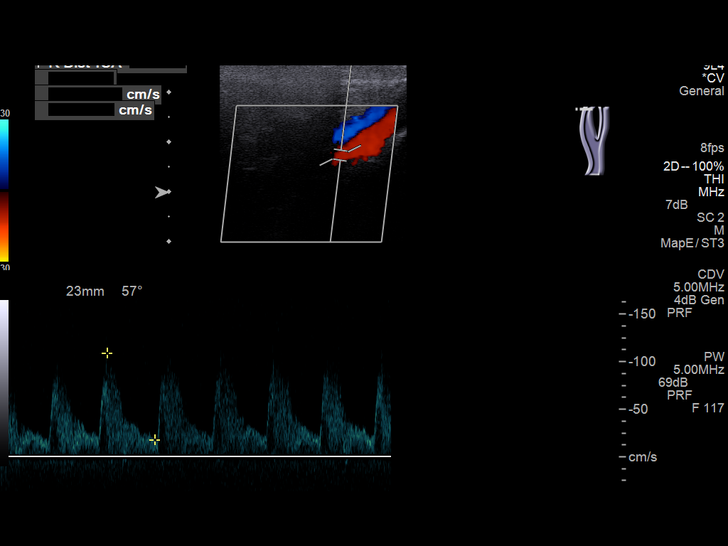
[im 43/82]
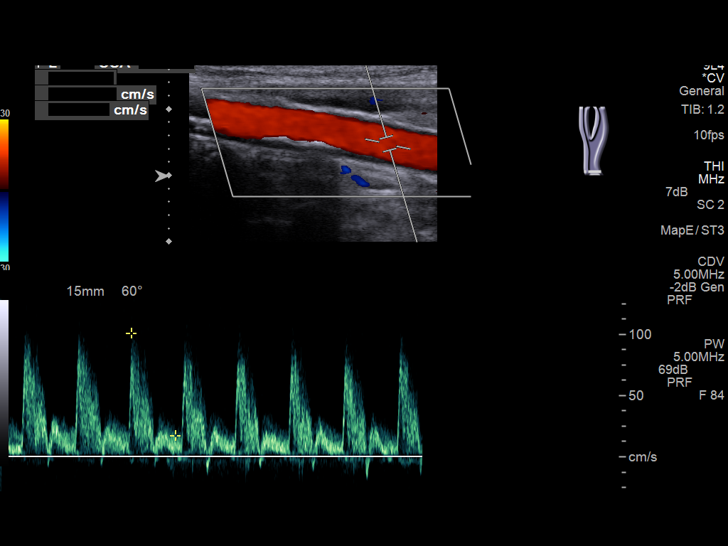
[im 46/82]
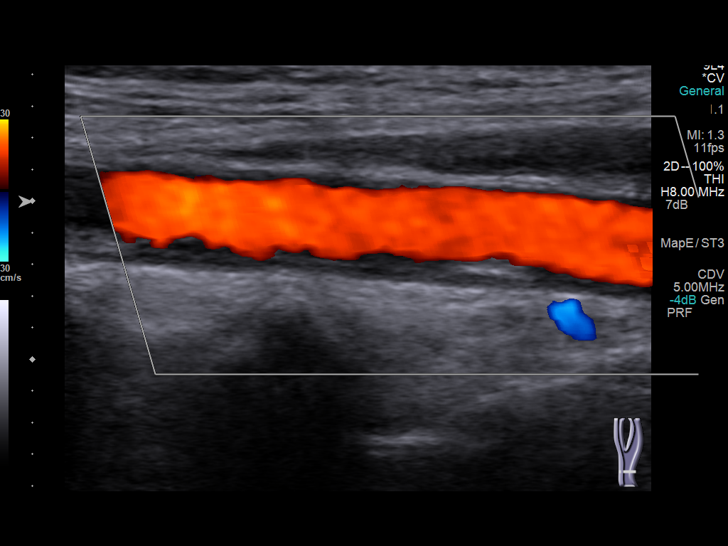
[im 53/82]
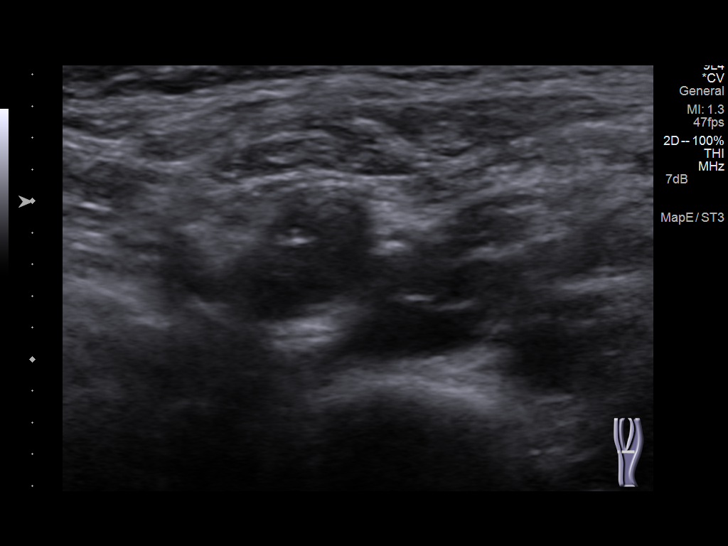
[im 60/82]
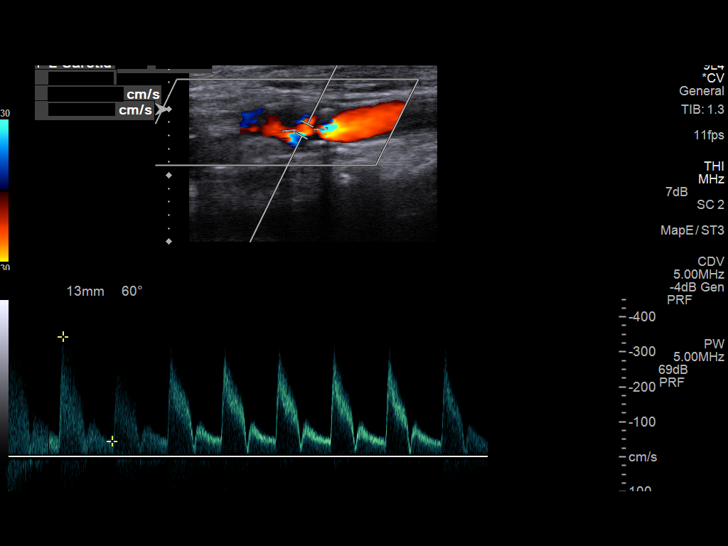
[im 67/82]
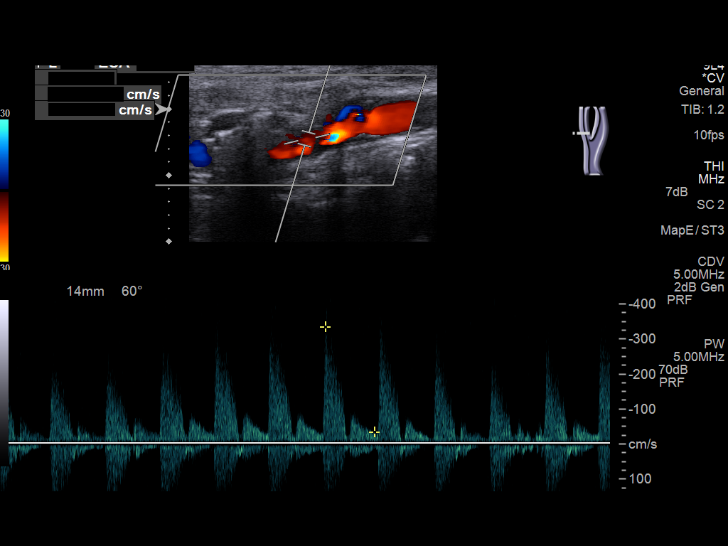
[im 74/82]
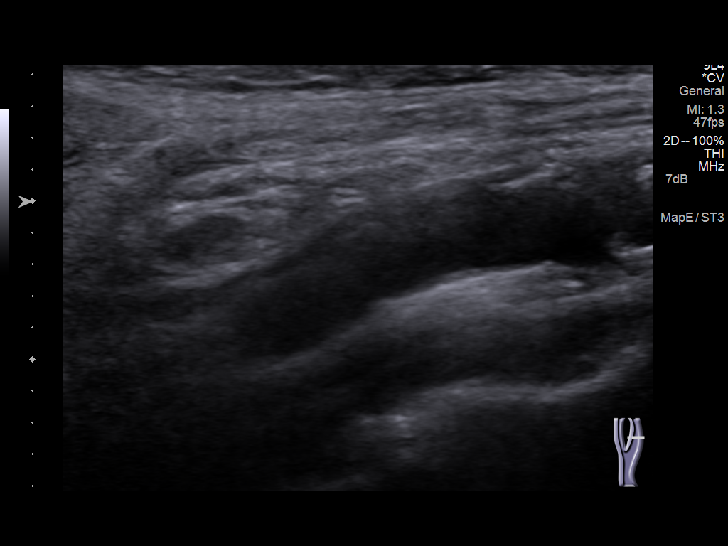
[im 82/82]
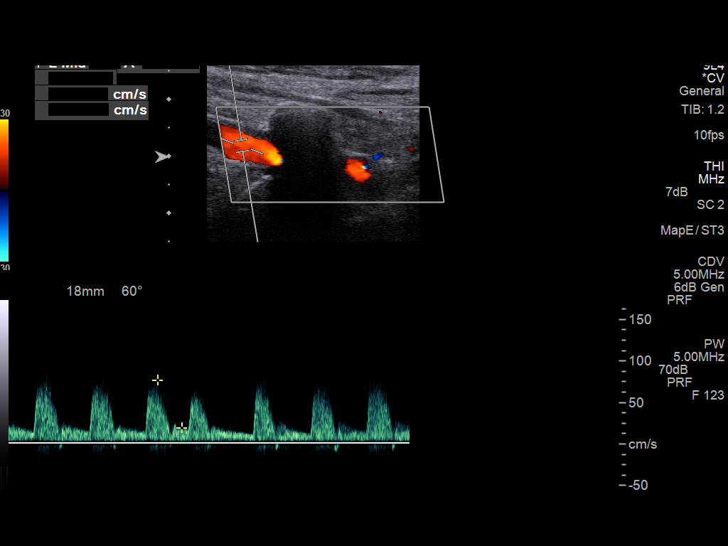

[13 of 24 positions shown; findings below may reference images not displayed]

REVIEW OF SYSTEMS:
Quantification of carotid stenosis is based on velocity parameters
that correlate the residual internal carotid diameter with
NASCET-based stenosis levels, using the diameter of the distal
internal carotid lumen as the denominator for stenosis measurement.

The following velocity measurements were obtained:

PEAK SYSTOLIC/END DIASTOLIC

RIGHT

ICA:                     188/24cm/sec

CCA:                     123/19cm/sec

SYSTOLIC ICA/CCA RATIO:

DIASTOLIC ICA/CCA RATIO:

ECA:                     308cm/sec

LEFT

ICA:                     464/63cm/sec

CCA:                     118/20cm/sec

SYSTOLIC ICA/CCA RATIO:

DIASTOLIC ICA/CCA RATIO:

ECA:                     335cm/sec
FINDINGS: RIGHT CAROTID ARTERY: Eccentric partially calcified plaque in the
mid and distal common carotid artery without high-grade stenosis.
Calcified plaque effaces the carotid bulb and extends into proximal
internal and external carotid arteries, with elevated waveforms of
the proximal external carotid artery. Normal color Doppler signal.

RIGHT VERTEBRAL ARTERY:  Normal flow direction and waveform.

LEFT CAROTID ARTERY: Eccentric calcified plaque through the common
carotid artery without high-grade stenosis. Partially calcified
heavy plaque in the carotid bulb and proximal ICA resulting in at
least moderate stenosis. Elevated peak systolic velocities in the
proximal ICA with aliasing on color Doppler interrogation.

LEFT VERTEBRAL ARTERY: Normal flow direction and waveform.
IMPRESSION: 1. Progression of the bilateral carotid bifurcation plaque,
resulting in 50-69% diameter stenosis on the right, 70-99% diameter
stenosis on the left.

## 2015-10-01 IMAGING — CR DG ABDOMEN ACUTE W/ 1V CHEST
4 series · 4 of 4 positions shown · non-contrast
Comparison: 11/24/2013

CLINICAL DATA: Generalized weakness

EXAM:
ACUTE ABDOMEN SERIES (ABDOMEN 2 VIEW & CHEST 1 VIEW)

[view not recorded (1 of 4)]
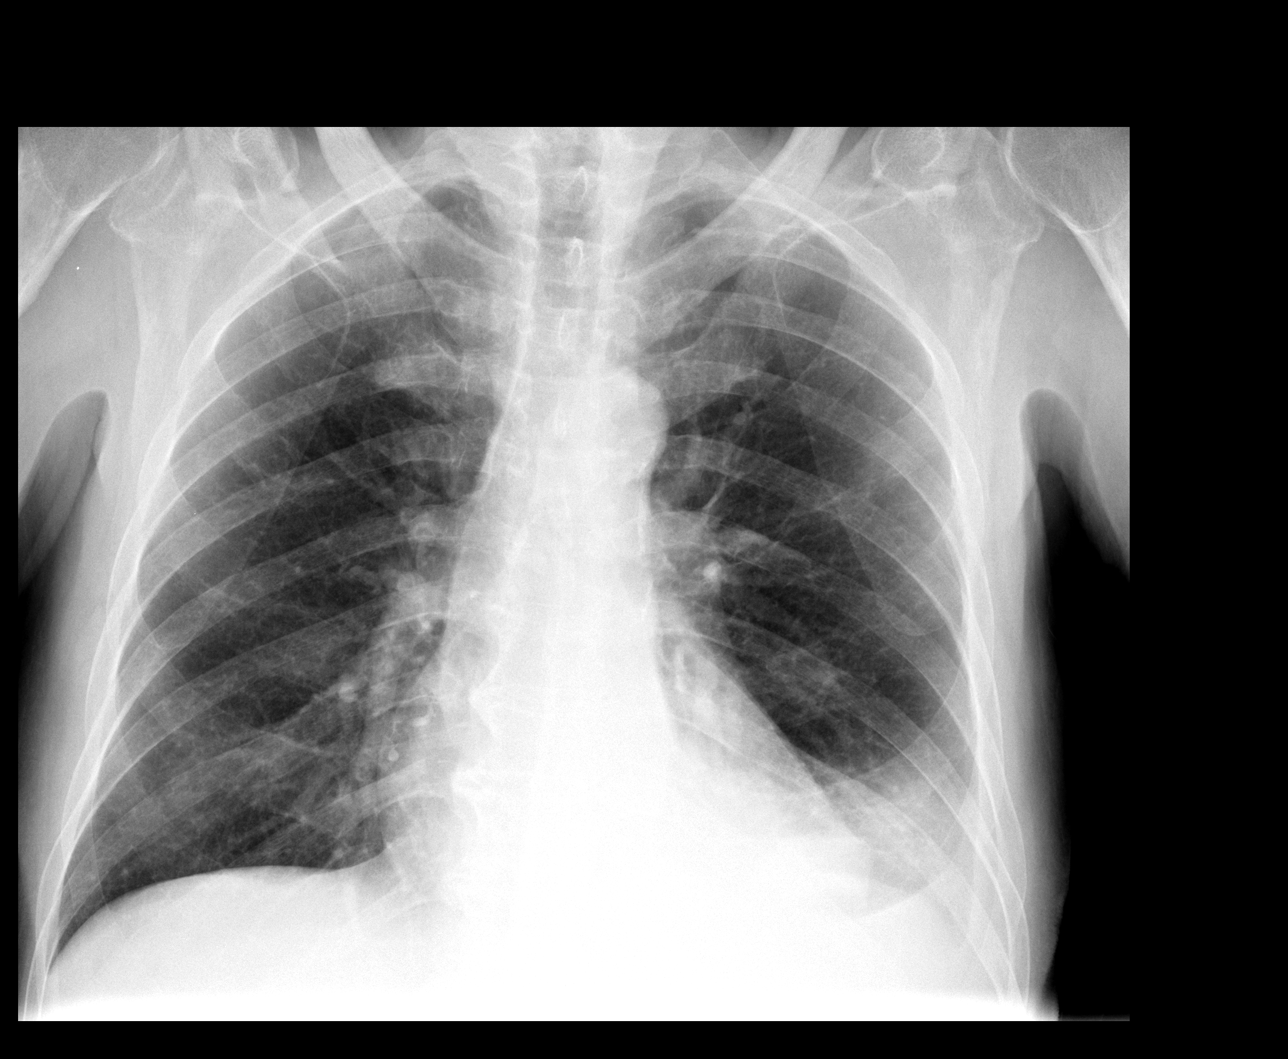

[view not recorded (2 of 4)]
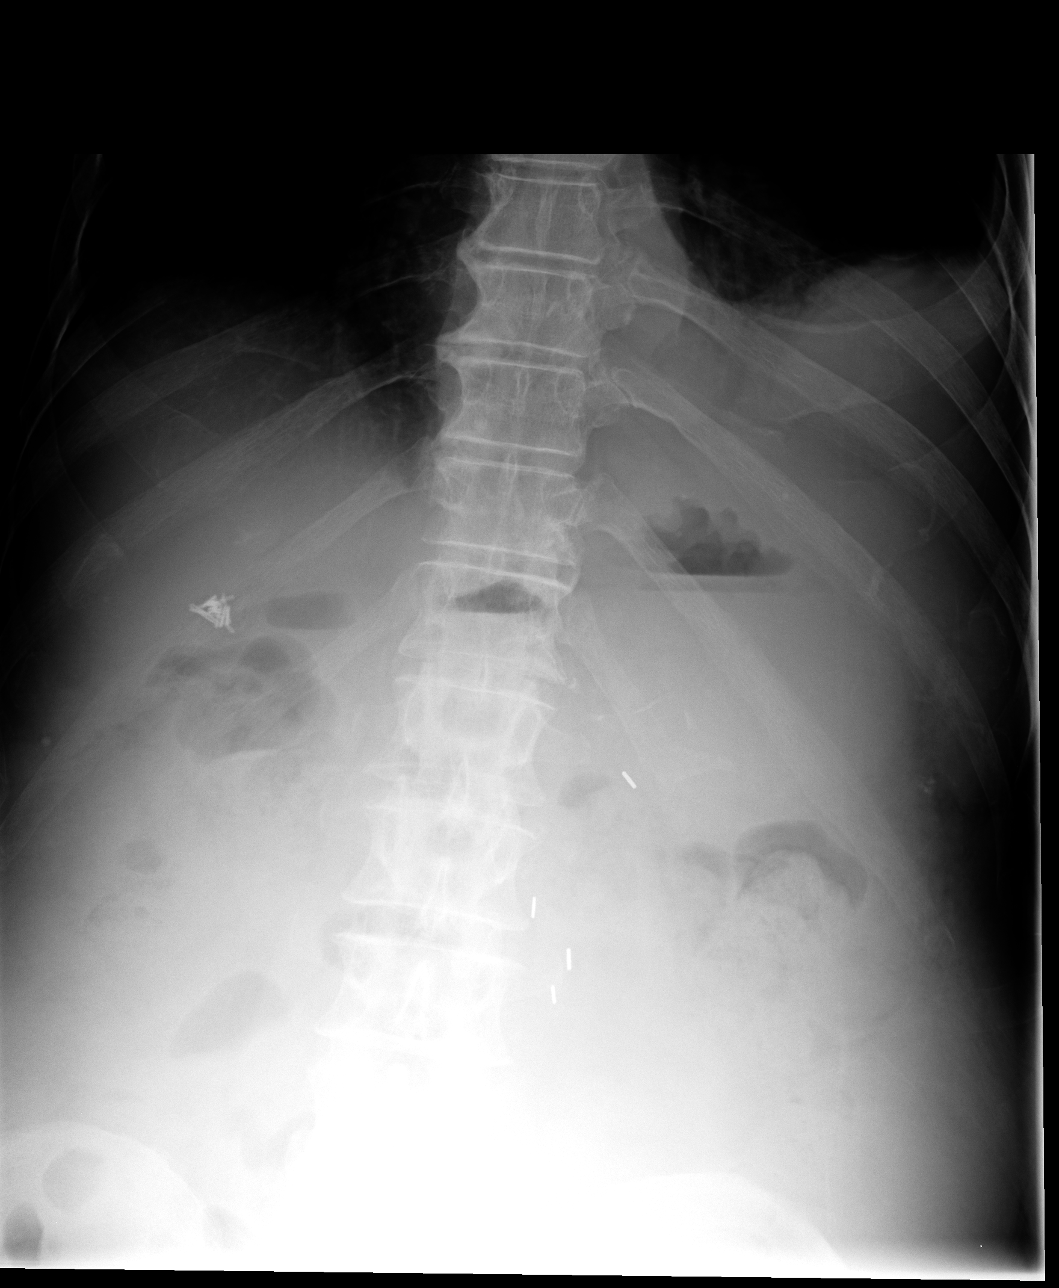

[view not recorded (3 of 4)]
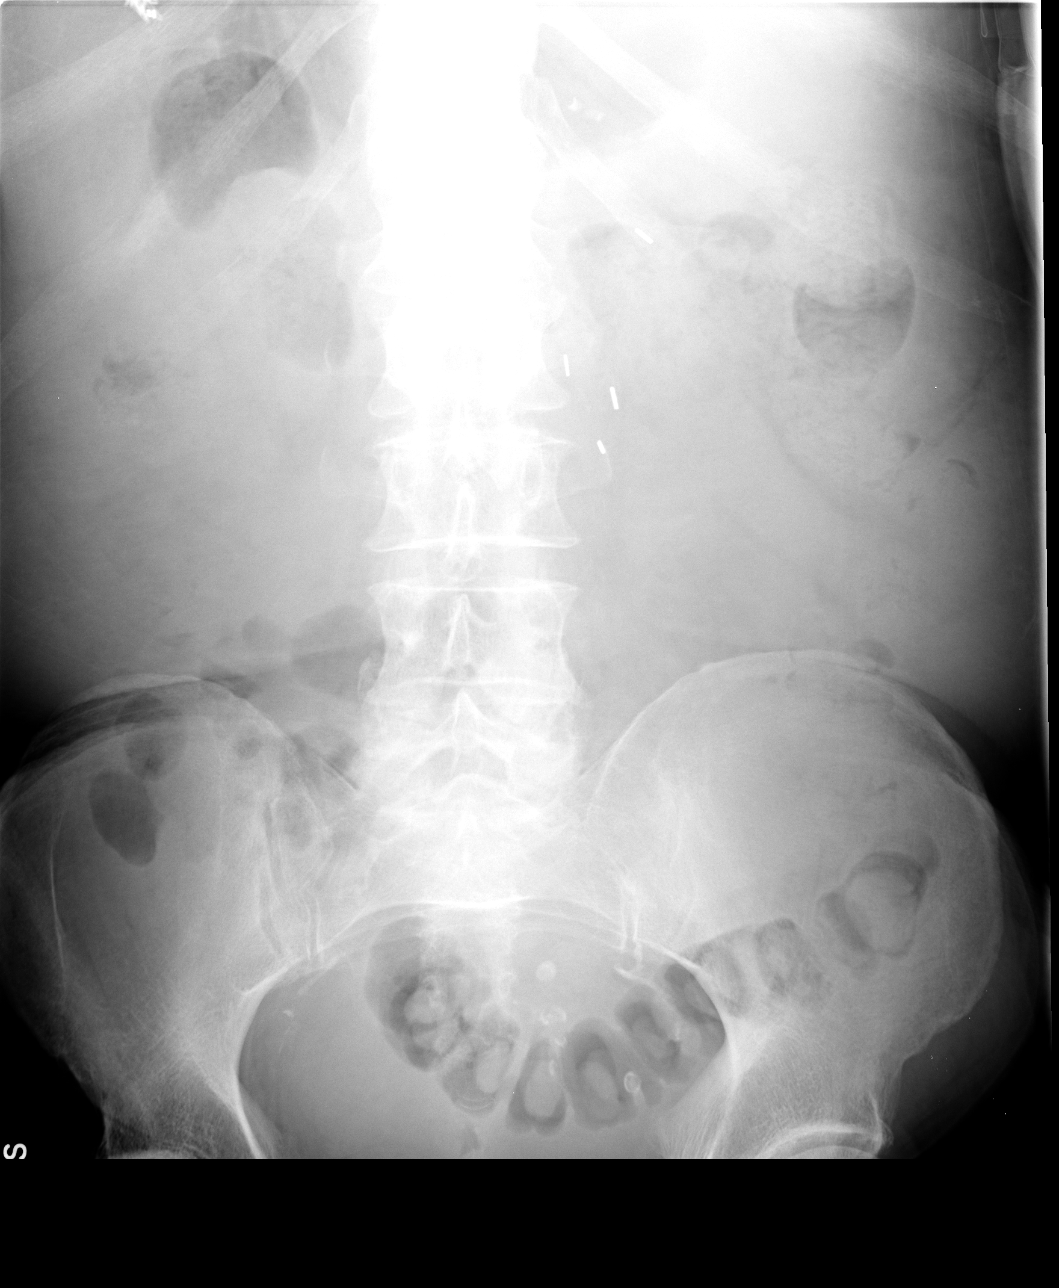

[view not recorded (4 of 4)]
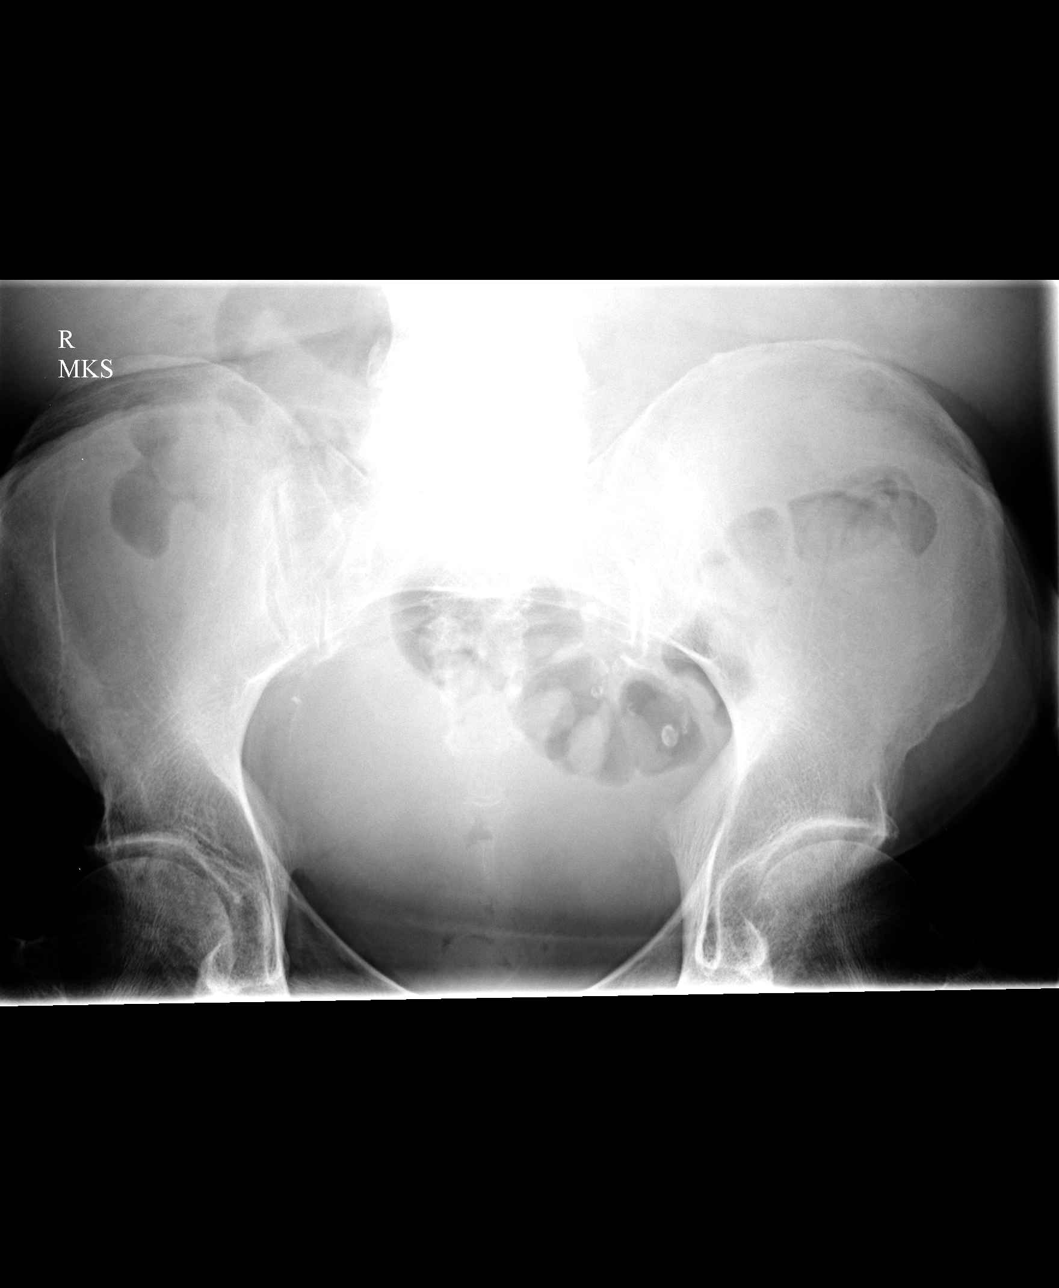

[4 of 4 positions shown; findings below may reference images not displayed]

FINDINGS: Small left effusion persist with left base collapse/consolidation.
Right lung remains clear. Normal heart size and vascularity.
Negative for pneumothorax. No gross free air but limited exam
because of positioning. Scattered air and stool throughout the
bowel. No significant obstruction pattern. Postop changes noted
diffusely. Vascular calcifications evident.
IMPRESSION: Small left effusion with left base atelectasis/ consolidation.
Pneumonia not excluded.

No free air

Negative for obstruction

Stable chronic postoperative findings

## 2016-12-16 ENCOUNTER — Encounter: Payer: Self-pay | Admitting: Internal Medicine
